# Patient Record
Sex: Female | Born: 1975 | Race: White | Hispanic: No | Marital: Married | Smoking: Never smoker
Health system: Southern US, Community
[De-identification: ages and names within clinical notes are randomized; demographics above are authoritative.]

## PROBLEM LIST (undated history)

## (undated) DIAGNOSIS — T8859XA Other complications of anesthesia, initial encounter: Secondary | ICD-10-CM

## (undated) DIAGNOSIS — T4145XA Adverse effect of unspecified anesthetic, initial encounter: Secondary | ICD-10-CM

## (undated) DIAGNOSIS — J302 Other seasonal allergic rhinitis: Secondary | ICD-10-CM

## (undated) HISTORY — PX: WISDOM TOOTH EXTRACTION: SHX21

## (undated) HISTORY — PX: TONSILLECTOMY: SUR1361

---

## 2013-11-26 NOTE — L&D Delivery Note (Signed)
Delivery Note At 11:05 AM a viable and healthy female was delivered via Vaginal, Spontaneous Delivery (Presentation: LOA ).  APGAR: 9, 9; weight  pending.   Placenta status: Intact, Spontaneous.  Cord: 3 vessels with the following complications: None.  Cord pH: na  Anesthesia: Epidural Local 1% xylocaine Episiotomy: None Lacerations: 2nd degree;Perineal Suture Repair: 2.0 3.0 vicryl rapide Est. Blood Loss (mL):  200  Mom to postpartum.  Baby to Couplet care / Skin to Skin.  Adaia Matthies J 11/17/2014, 11:29 AM

## 2014-05-12 LAB — OB RESULTS CONSOLE RPR: RPR: NONREACTIVE

## 2014-05-12 LAB — OB RESULTS CONSOLE HEPATITIS B SURFACE ANTIGEN: Hepatitis B Surface Ag: NEGATIVE

## 2014-05-12 LAB — OB RESULTS CONSOLE ANTIBODY SCREEN: ANTIBODY SCREEN: NEGATIVE

## 2014-05-12 LAB — OB RESULTS CONSOLE ABO/RH: RH Type: POSITIVE

## 2014-05-12 LAB — OB RESULTS CONSOLE HIV ANTIBODY (ROUTINE TESTING): HIV: NONREACTIVE

## 2014-05-12 LAB — OB RESULTS CONSOLE RUBELLA ANTIBODY, IGM: Rubella: IMMUNE

## 2014-05-19 LAB — OB RESULTS CONSOLE GC/CHLAMYDIA
Chlamydia: NEGATIVE
Gonorrhea: NEGATIVE

## 2014-08-10 ENCOUNTER — Inpatient Hospital Stay (HOSPITAL_COMMUNITY): Admission: AD | Admit: 2014-08-10 | Payer: Self-pay | Source: Ambulatory Visit | Admitting: Obstetrics and Gynecology

## 2014-11-17 ENCOUNTER — Inpatient Hospital Stay (HOSPITAL_COMMUNITY): Payer: 59 | Admitting: Anesthesiology

## 2014-11-17 ENCOUNTER — Inpatient Hospital Stay (HOSPITAL_COMMUNITY)
Admission: AD | Admit: 2014-11-17 | Discharge: 2014-11-19 | DRG: 775 | Disposition: A | Discharge status: Home / Self Care | Payer: 59 | Source: Ambulatory Visit | Attending: Obstetrics and Gynecology | Admitting: Obstetrics and Gynecology

## 2014-11-17 ENCOUNTER — Encounter (HOSPITAL_COMMUNITY): Payer: Self-pay | Admitting: *Deleted

## 2014-11-17 DIAGNOSIS — IMO0001 Reserved for inherently not codable concepts without codable children: Secondary | ICD-10-CM

## 2014-11-17 DIAGNOSIS — Z3403 Encounter for supervision of normal first pregnancy, third trimester: Secondary | ICD-10-CM | POA: Diagnosis present

## 2014-11-17 DIAGNOSIS — Z3A36 36 weeks gestation of pregnancy: Secondary | ICD-10-CM | POA: Diagnosis present

## 2014-11-17 HISTORY — DX: Other complications of anesthesia, initial encounter: T88.59XA

## 2014-11-17 HISTORY — DX: Adverse effect of unspecified anesthetic, initial encounter: T41.45XA

## 2014-11-17 LAB — CBC
HCT: 40.9 % (ref 36.0–46.0)
HEMOGLOBIN: 13.9 g/dL (ref 12.0–15.0)
MCH: 30.8 pg (ref 26.0–34.0)
MCHC: 34 g/dL (ref 30.0–36.0)
MCV: 90.7 fL (ref 78.0–100.0)
Platelets: 281 10*3/uL (ref 150–400)
RBC: 4.51 MIL/uL (ref 3.87–5.11)
RDW: 13.6 % (ref 11.5–15.5)
WBC: 10.2 10*3/uL (ref 4.0–10.5)

## 2014-11-17 LAB — OB RESULTS CONSOLE GBS: GBS: NEGATIVE

## 2014-11-17 LAB — RPR

## 2014-11-17 LAB — GROUP B STREP BY PCR: GROUP B STREP BY PCR: NEGATIVE

## 2014-11-17 MED ORDER — IBUPROFEN 600 MG PO TABS
600.0000 mg | ORAL_TABLET | Freq: Four times a day (QID) | ORAL | Status: DC
  Administered 2014-11-18 – 2014-11-19 (×5): 600 mg via ORAL
  Filled 2014-11-17 (×8): qty 1

## 2014-11-17 MED ORDER — PHENYLEPHRINE 40 MCG/ML (10ML) SYRINGE FOR IV PUSH (FOR BLOOD PRESSURE SUPPORT)
80.0000 ug | PREFILLED_SYRINGE | INTRAVENOUS | Status: DC | PRN
  Filled 2014-11-17: qty 2

## 2014-11-17 MED ORDER — ONDANSETRON HCL 4 MG/2ML IJ SOLN: 4.0000 mg | INTRAMUSCULAR | Status: DC | PRN

## 2014-11-17 MED ORDER — PHENYLEPHRINE 40 MCG/ML (10ML) SYRINGE FOR IV PUSH (FOR BLOOD PRESSURE SUPPORT)
PREFILLED_SYRINGE | INTRAVENOUS | Status: AC
  Filled 2014-11-17: qty 10

## 2014-11-17 MED ORDER — ZOLPIDEM TARTRATE 5 MG PO TABS: 5.0000 mg | ORAL_TABLET | Freq: Every evening | ORAL | Status: DC | PRN

## 2014-11-17 MED ORDER — DIPHENHYDRAMINE HCL 25 MG PO CAPS: 25.0000 mg | ORAL_CAPSULE | Freq: Four times a day (QID) | ORAL | Status: DC | PRN

## 2014-11-17 MED ORDER — SIMETHICONE 80 MG PO CHEW: 80.0000 mg | CHEWABLE_TABLET | ORAL | Status: DC | PRN

## 2014-11-17 MED ORDER — TERBUTALINE SULFATE 1 MG/ML IJ SOLN: 0.2500 mg | Freq: Once | INTRAMUSCULAR | Status: DC | PRN

## 2014-11-17 MED ORDER — ONDANSETRON HCL 4 MG/2ML IJ SOLN: 4.0000 mg | Freq: Four times a day (QID) | INTRAMUSCULAR | Status: DC | PRN

## 2014-11-17 MED ORDER — FLEET ENEMA 7-19 GM/118ML RE ENEM: 1.0000 | ENEMA | RECTAL | Status: DC | PRN

## 2014-11-17 MED ORDER — DIBUCAINE 1 % RE OINT
1.0000 "application " | TOPICAL_OINTMENT | RECTAL | Status: DC | PRN
  Administered 2014-11-19: 1 via RECTAL
  Filled 2014-11-17: qty 28

## 2014-11-17 MED ORDER — LIDOCAINE HCL (PF) 1 % IJ SOLN
30.0000 mL | INTRAMUSCULAR | Status: DC | PRN
  Administered 2014-11-17: 30 mL via SUBCUTANEOUS
  Filled 2014-11-17: qty 30

## 2014-11-17 MED ORDER — LACTATED RINGERS IV SOLN: 500.0000 mL | INTRAVENOUS | Status: DC | PRN

## 2014-11-17 MED ORDER — SENNOSIDES-DOCUSATE SODIUM 8.6-50 MG PO TABS
2.0000 | ORAL_TABLET | ORAL | Status: DC
  Administered 2014-11-18 – 2014-11-19 (×2): 2 via ORAL
  Filled 2014-11-17 (×2): qty 2

## 2014-11-17 MED ORDER — WITCH HAZEL-GLYCERIN EX PADS
1.0000 "application " | MEDICATED_PAD | CUTANEOUS | Status: DC | PRN
  Administered 2014-11-19: 1 via TOPICAL

## 2014-11-17 MED ORDER — LACTATED RINGERS IV SOLN
INTRAVENOUS | Status: DC
  Administered 2014-11-17 (×2): via INTRAVENOUS

## 2014-11-17 MED ORDER — EPHEDRINE 5 MG/ML INJ
10.0000 mg | INTRAVENOUS | Status: DC | PRN
  Filled 2014-11-17: qty 2

## 2014-11-17 MED ORDER — FENTANYL 2.5 MCG/ML BUPIVACAINE 1/10 % EPIDURAL INFUSION (WH - ANES)
14.0000 mL/h | INTRAMUSCULAR | Status: DC | PRN
  Administered 2014-11-17: 14 mL/h via EPIDURAL

## 2014-11-17 MED ORDER — METHYLERGONOVINE MALEATE 0.2 MG PO TABS: 0.2000 mg | ORAL_TABLET | ORAL | Status: DC | PRN

## 2014-11-17 MED ORDER — OXYTOCIN BOLUS FROM INFUSION: 500.0000 mL | INTRAVENOUS | Status: DC

## 2014-11-17 MED ORDER — LIDOCAINE HCL (PF) 1 % IJ SOLN
INTRAMUSCULAR | Status: DC | PRN
  Administered 2014-11-17: 4 mL
  Administered 2014-11-17: 6 mL

## 2014-11-17 MED ORDER — OXYCODONE-ACETAMINOPHEN 5-325 MG PO TABS: 1.0000 | ORAL_TABLET | ORAL | Status: DC | PRN

## 2014-11-17 MED ORDER — TETANUS-DIPHTH-ACELL PERTUSSIS 5-2.5-18.5 LF-MCG/0.5 IM SUSP: 0.5000 mL | Freq: Once | INTRAMUSCULAR | Status: DC

## 2014-11-17 MED ORDER — PRENATAL MULTIVITAMIN CH
1.0000 | ORAL_TABLET | Freq: Every day | ORAL | Status: DC
  Administered 2014-11-18: 1 via ORAL
  Filled 2014-11-17 (×2): qty 1

## 2014-11-17 MED ORDER — FENTANYL 2.5 MCG/ML BUPIVACAINE 1/10 % EPIDURAL INFUSION (WH - ANES)
INTRAMUSCULAR | Status: AC
  Filled 2014-11-17: qty 125

## 2014-11-17 MED ORDER — METHYLERGONOVINE MALEATE 0.2 MG/ML IJ SOLN: 0.2000 mg | INTRAMUSCULAR | Status: DC | PRN

## 2014-11-17 MED ORDER — FENTANYL 2.5 MCG/ML BUPIVACAINE 1/10 % EPIDURAL INFUSION (WH - ANES): 14.0000 mL/h | INTRAMUSCULAR | Status: DC | PRN

## 2014-11-17 MED ORDER — LACTATED RINGERS IV SOLN
500.0000 mL | Freq: Once | INTRAVENOUS | Status: AC
  Administered 2014-11-17: 500 mL via INTRAVENOUS

## 2014-11-17 MED ORDER — OXYCODONE-ACETAMINOPHEN 5-325 MG PO TABS: 2.0000 | ORAL_TABLET | ORAL | Status: DC | PRN

## 2014-11-17 MED ORDER — CITRIC ACID-SODIUM CITRATE 334-500 MG/5ML PO SOLN: 30.0000 mL | ORAL | Status: DC | PRN

## 2014-11-17 MED ORDER — TERBUTALINE SULFATE 1 MG/ML IJ SOLN
0.2500 mg | Freq: Once | INTRAMUSCULAR | Status: DC | PRN
Start: 2014-11-17 — End: 2014-11-17

## 2014-11-17 MED ORDER — OXYTOCIN 40 UNITS IN LACTATED RINGERS INFUSION - SIMPLE MED
62.5000 mL/h | INTRAVENOUS | Status: DC
  Administered 2014-11-17: 62.5 mL/h via INTRAVENOUS

## 2014-11-17 MED ORDER — BENZOCAINE-MENTHOL 20-0.5 % EX AERO
1.0000 "application " | INHALATION_SPRAY | CUTANEOUS | Status: DC | PRN
  Administered 2014-11-17: 1 via TOPICAL
  Filled 2014-11-17: qty 56

## 2014-11-17 MED ORDER — DIPHENHYDRAMINE HCL 50 MG/ML IJ SOLN: 12.5000 mg | INTRAMUSCULAR | Status: DC | PRN

## 2014-11-17 MED ORDER — OXYTOCIN 40 UNITS IN LACTATED RINGERS INFUSION - SIMPLE MED
1.0000 m[IU]/min | INTRAVENOUS | Status: DC
  Administered 2014-11-17: 2 m[IU]/min via INTRAVENOUS
  Filled 2014-11-17: qty 1000

## 2014-11-17 MED ORDER — ONDANSETRON HCL 4 MG PO TABS: 4.0000 mg | ORAL_TABLET | ORAL | Status: DC | PRN

## 2014-11-17 MED ORDER — LANOLIN HYDROUS EX OINT: TOPICAL_OINTMENT | CUTANEOUS | Status: DC | PRN

## 2014-11-17 MED ORDER — ACETAMINOPHEN 325 MG PO TABS: 650.0000 mg | ORAL_TABLET | ORAL | Status: DC | PRN

## 2014-11-17 NOTE — Anesthesia Procedure Notes (Signed)
Epidural Patient location during procedure: OB  Preanesthetic Checklist Completed: patient identified, site marked, surgical consent, pre-op evaluation, timeout performed, IV checked, risks and benefits discussed and monitors and equipment checked  Epidural Patient position: sitting Prep: site prepped and draped and DuraPrep Patient monitoring: continuous pulse ox and blood pressure Approach: midline Location: L3-L4 Injection technique: LOR air  Needle:  Needle type: Tuohy  Needle gauge: 17 G Needle length: 9 cm and 9 Needle insertion depth: 4 cm Catheter type: closed end flexible Catheter size: 19 Gauge Catheter at skin depth: 10 cm Test dose: negative  Assessment Events: blood not aspirated, injection not painful, no injection resistance, negative IV test and no paresthesia  Additional Notes Dosing of Epidural:  1st dose, through catheter .............................................  Xylocaine 40 mg  2nd dose, through catheter, after waiting 3 minutes.........Xylocaine 60 mg    ( 1% Xylo charted as a single dose in Epic Meds for ease of charting; actual dosing was fractionated as above, for saftey's sake)  As each dose occurred, patient was free of IV sx; and patient exhibited no evidence of SA injection.  Patient is more comfortable after epidural dosed. Please see RN's note for documentation of vital signs,and FHR which are stable.  Patient reminded not to try to ambulate with numb legs, and that an RN must be present when she attempts to get up.       

## 2014-11-17 NOTE — Anesthesia Preprocedure Evaluation (Signed)

## 2014-11-17 NOTE — MAU Note (Signed)
Rapid GBS collected by Katha Hammingenise Warren-Collison, RN

## 2014-11-17 NOTE — Anesthesia Postprocedure Evaluation (Signed)
  Anesthesia Post-op Note  Anesthesia Post Note  Patient: Ermalinda BarriosJulia Stefan  Procedure(s) Performed: * No procedures listed *  Anesthesia type: Epidural  Patient location: Mother/Baby  Post pain: Pain level controlled  Post assessment: Post-op Vital signs reviewed  Last Vitals:  Filed Vitals:   11/17/14 1450  BP: 127/71  Pulse: 100  Temp:   Resp: 18    Post vital signs: Reviewed  Level of consciousness:alert  Complications: No apparent anesthesia complications

## 2014-11-17 NOTE — Progress Notes (Signed)
Admission nutrition screen triggered for unintentional weight loss > 10 lbs within the last month. PNR indicates that there has been no weight loss. Patients chart reviewed and assessed  for nutritional risk. Patient is determined to be at low nutrition  risk.   Elisabeth CaraKatherine Glady Ouderkirk M.Odis LusterEd. R.D. LDN Neonatal Nutrition Support Specialist/RD III Pager [phone]

## 2014-11-17 NOTE — MAU Note (Signed)
PT  SAYS SHE WAS   SLEEPING  AND  SHE FELT A  GUSH-    CLEAR.    VE IN OFFICE -   LAST WEEK-  CLOSED -    TOLD  HEAD  DOWN.     DENIES HSV AND MRSA.   GBS-   NO  RESULTS.   NO UC.

## 2014-11-17 NOTE — H&P (Signed)
Destiny Stewart is a 38 y.o. female presenting for SROM at 1245.  Maternal Medical History:  Reason for admission: Rupture of membranes.   Contractions: Onset was less than 1 hour ago.   Perceived severity is mild.    Fetal activity: Perceived fetal activity is normal.   Last perceived fetal movement was within the past hour.    Prenatal complications: no prenatal complications Prenatal Complications - Diabetes: none.    OB History    Gravida Para Term Preterm AB TAB SAB Ectopic Multiple Living   1              Past Medical History  Diagnosis Date  . Complication of anesthesia    Past Surgical History  Procedure Laterality Date  . Tonsillectomy    . Wisdom tooth extraction     Family History: family history is not on file. Social History:  reports that she has never smoked. She does not have any smokeless tobacco history on file. She reports that she does not drink alcohol or use illicit drugs.   Prenatal Transfer Tool  Maternal Diabetes: No Genetic Screening: Normal Maternal Ultrasounds/Referrals: Normal Fetal Ultrasounds or other Referrals:  None Maternal Substance Abuse:  No Significant Maternal Medications:  None Significant Maternal Lab Results:  None Other Comments:  None  Review of Systems  All other systems reviewed and are negative.   Dilation: 1 Effacement (%): 70 Station: -3 Exam by:: Destiny Stewart Blood pressure 122/91, pulse 89, temperature 98.7 F (37.1 C), temperature source Oral, resp. rate 18, height 5\' 7"  (1.702 m), weight 81.647 kg (180 lb). Maternal Exam:  Uterine Assessment: Contraction strength is mild.  Contraction frequency is irregular.   Abdomen: Patient reports no abdominal tenderness. Fetal presentation: vertex  Introitus: Normal vulva. Normal vagina.  Ferning test: positive.  Nitrazine test: positive. Amniotic fluid character: clear.  Pelvis: adequate for delivery.   Cervix: Cervix evaluated by digital exam.     Physical  Exam  Nursing note and vitals reviewed. Constitutional: She is oriented to person, place, and time. She appears well-developed and well-nourished.  HENT:  Head: Normocephalic and atraumatic.  Neck: Normal range of motion. Neck supple.  Cardiovascular: Normal rate and regular rhythm.   Respiratory: Effort normal and breath sounds normal.  GI: Soft. Bowel sounds are normal.  Genitourinary: Vagina normal and uterus normal.  Musculoskeletal: Normal range of motion.  Neurological: She is alert and oriented to person, place, and time. She has normal reflexes.  Skin: Skin is warm and dry.  Psychiatric: She has a normal mood and affect.    Prenatal labs: ABO, Rh: A/Positive/-- (06/17 0000) Antibody: Negative (06/17 0000) Rubella: Immune (06/17 0000) RPR: Nonreactive (06/17 0000)  HBsAg: Negative (06/17 0000)  HIV: Non-reactive (06/17 0000)  GBS: Negative (12/23 0215)   Assessment/Plan: Preterm SROM Admit Rapid GBS Augment prn   Destiny Stewart 11/17/2014, 7:06 AM

## 2014-11-17 NOTE — Lactation Note (Signed)
This note was copied from the chart of Destiny Ermalinda BarriosJulia Denio. Lactation Consultation Note Initial visit at 12 hours of age.  Mom reports baby has latched a few times and begins to get sleepy quickly.  Discussed late preterm baby behaviors with information sheet shared.  Set up DEBP with instructions on use, cleaning and milk storage.  Mom plans to post pump every 3 hours/ after feedings for 15 minutes on preemie setting then hand express and spoon feed any collected colostrum. Mom has a inverted nipple on right breast with yellow colostrum visible.  Hand pump pulls nipple out briefly and then flattens with compression.  Encouraged mom to attempt with hand pump and baby latching before attempting with nipple shield.  Tiny abrasion noted on side of nipple with drop of blood.  Encouraged mom to rub into nipples EBE for comfort.  Pumping plan discussed with MBU RN.  [facility] resources given and discussed.  Encouraged to feed with early cues on demand.  Early newborn behavior discussed.  Hand expression demonstrated with colostrum visible.  Mom to call for assist as needed.     Patient Name: Destiny Ermalinda BarriosJulia Cerny Today's Date: 11/17/2014 Reason for consult: Initial assessment   Maternal Data Has patient been taught Hand Expression?: Yes Does the patient have breastfeeding experience prior to this delivery?: No  Feeding    LATCH Score/Interventions                      Lactation Tools Discussed/Used Pump Review: Setup, frequency, and cleaning Initiated by:: JS Date initiated:: 11/17/14   Consult Status Consult Status: Follow-up Date: 11/18/14 Follow-up type: In-patient    Jannifer RodneyShoptaw, Suda Forbess Lynn 11/17/2014, 11:09 PM

## 2014-11-18 LAB — CBC
HEMATOCRIT: 29.8 % — AB (ref 36.0–46.0)
HEMOGLOBIN: 9.9 g/dL — AB (ref 12.0–15.0)
MCH: 30.6 pg (ref 26.0–34.0)
MCHC: 33.2 g/dL (ref 30.0–36.0)
MCV: 92 fL (ref 78.0–100.0)
Platelets: 226 10*3/uL (ref 150–400)
RBC: 3.24 MIL/uL — AB (ref 3.87–5.11)
RDW: 13.8 % (ref 11.5–15.5)
WBC: 13.9 10*3/uL — AB (ref 4.0–10.5)

## 2014-11-18 NOTE — Progress Notes (Signed)
PPD 1 SVD  S:  Reports feeling well             Tolerating po/ No nausea or vomiting             Bleeding is light             Pain controlled with motrin but feels like she does not even need that             Up ad lib / ambulatory / voiding QS  Newborn breast feeding with poor feeding - lactation assisting / supplementing with formula per Peds             Circumcision planned tomorrow AM - baby not feeding well enough for DC today  O:               VS: BP 122/70 mmHg  Pulse 75  Temp(Src) 98.4 F (36.9 C) (Oral)  Resp 18  Ht 5\' 7"  (1.702 m)  Wt 81.647 kg (180 lb)  BMI 28.19 kg/m2  SpO2 99%  Breastfeeding? Unknown   LABS:              Recent Labs  11/17/14 0200 11/18/14 0625  WBC 10.2 13.9*  HGB 13.9 9.9*  PLT 281 226               Blood type: A/Positive/-- (06/17 0000)  Rubella: Immune (06/17 0000)                     I&O: Intake/Output      12/23 0701 - 12/24 0700 12/24 0701 - 12/25 0700   Blood 200    Total Output 200     Net -200                        Physical Exam:             Alert and oriented X3  Lungs: Clear and unlabored  Heart: regular rate and rhythm / no mumurs  Abdomen: soft, non-tender, non-distended              Fundus: firm, non-tender, U-1  Perineum: mild edema  Lochia: light  Extremities: no edema, no calf pain or tenderness    A: PPD # 1   Doing well - stable status  P: Routine post partum orders  Anticipate DC tomorrow             Lactation assist today  [name], Christel Bai CNM, MSN, [facility] 11/18/2014, 10:07 AM

## 2014-11-18 NOTE — Lactation Note (Signed)
This note was copied from the chart of Destiny Stewart. Lactation Consultation Note  Patient Name: Destiny Stewart ZOXWR'UToday's Date: 11/18/2014 Reason for consult: Follow-up assessment;Late preterm infant LC observed baby latched to (R) with NS and mom says she needed several attempts to get him to latch and he has been sucking intermittently but she is not sure if baby is "getting anything" yet.  Baby is STS and positioned in cradle hold so LC suggests trying cross-cradle and baby slips off but re-latches easily.  There is visible colostrum in NS.  LC reviewed LPI supplement recommendations and encouraged mom to now give 10-20 ml's of ebm or formula after breastfeeding for 20-30 minutes.  Alternate breast compression while baby is feeding and ensuring deep latch and areolar grasp throughout feeding recommended.  LC discussed how colostrum flow with DEBP is difficult and encouraged her to also combine some breast massage and hand expression with pumping, pump q3h for 15 minutes.    Maternal Data    Feeding Feeding Type: Breast Fed Length of feed: 15 min  LATCH Score/Interventions Latch: Repeated attempts needed to sustain latch, nipple held in mouth throughout feeding, stimulation needed to elicit sucking reflex. (per mom's report) Intervention(s): Skin to skin;Waking techniques (breast compression and stimulation to encourage sucking) Intervention(s): Adjust position;Breast compression (switched to cross-cradle with LC after 15 minutes)  Audible Swallowing: A few with stimulation Intervention(s): Skin to skin;Hand expression Intervention(s): Skin to skin;Hand expression;Alternate breast massage  Type of Nipple: Everted at rest and after stimulation  Comfort (Breast/Nipple): Soft / non-tender     Hold (Positioning): No assistance needed to correctly position infant at breast. Intervention(s): Breastfeeding basics reviewed;Support Pillows;Position options;Skin to skin (encouraged  cross-cradle for more control of breast and baby throughout feeding)  LATCH Score: 8 (LC observed abd then assisted with re-latching and use of cross-cradle position)  Lactation Tools Discussed/Used Tools: Nipple Shields;Pump;Other (comment) (curved-tip syringe for finger-feeding) LPI plan: limit breastfeeding to 20-30 minutes and stimulate baby as needed, feed q3h and then DEBP for 15 minutes, feed ebm and/or formula (10-20 ml's based on day of life)  Consult Status Consult Status: Follow-up Date: 11/19/14 Follow-up type: In-patient    Warrick ParisianBryant, [facility] 11/18/2014, 4:37 PM

## 2014-11-18 NOTE — Lactation Note (Signed)
This note was copied from the chart of Destiny Ermalinda BarriosJulia Gum. Lactation Consultation Note  Patient Name: Destiny Ermalinda BarriosJulia Stewart ZOXWR'UToday's Date: 11/18/2014 Reason for consult: Follow-up assessment;Late preterm infant.  RN, Gaylyn RongKris asked LC to provide comfort gelpads   Maternal Data    Feeding Feeding Type: Breast Fed Length of feed: 25 min  LATCH Score/Interventions Latch: Repeated attempts needed to sustain latch, nipple held in mouth throughout feeding, stimulation needed to elicit sucking reflex. (per mom's report) Intervention(s): Skin to skin;Waking techniques (breast compression and stimulation to encourage sucking) Intervention(s): Adjust position;Breast compression (switched to cross-cradle with LC after 15 minutes)  Audible Swallowing: A few with stimulation Intervention(s): Skin to skin;Hand expression Intervention(s): Skin to skin;Hand expression;Alternate breast massage  Type of Nipple: Everted at rest and after stimulation  Comfort (Breast/Nipple): Soft / non-tender (RN, Gaylyn RongKris asked LC to provide comfort gelpads)     Hold (Positioning): No assistance needed to correctly position infant at breast. Intervention(s): Breastfeeding basics reviewed;Support Pillows;Position options;Skin to skin (encouraged cross-cradle for more control of breast and baby throughout feeding)  LATCH Score: 8  Lactation Tools Discussed/Used Tools: Nipple Shields;Pump;Other (comment);Comfort gels (curved-tip syringe for finger-feeding) Nipple shield size: 24 Comfort gelpads given with instructions for use after ebm on nipples  Consult Status Consult Status: Follow-up Date: 11/19/14 Follow-up type: In-patient    Warrick ParisianBryant, Jens Siems [facility] 11/18/2014, 5:07 PM

## 2014-11-19 MED ORDER — OXYCODONE-ACETAMINOPHEN 5-325 MG PO TABS: 1.0000 | ORAL_TABLET | ORAL | Status: DC | PRN

## 2014-11-19 MED ORDER — IBUPROFEN 600 MG PO TABS: 600.0000 mg | ORAL_TABLET | Freq: Four times a day (QID) | ORAL | Status: DC

## 2014-11-19 NOTE — Discharge Summary (Signed)
Obstetric Discharge Summary  Reason for Admission: onset of labor Prenatal Procedures: none Intrapartum Procedures: spontaneous vaginal delivery Postpartum Procedures: none Complications-Operative and Postpartum: 2nd degree perineal laceration HEMOGLOBIN  Date Value Ref Range Status  11/18/2014 9.9* 12.0 - 15.0 g/dL Final    Comment:    DELTA CHECK NOTED REPEATED TO VERIFY    HCT  Date Value Ref Range Status  11/18/2014 29.8* 36.0 - 46.0 % Final    Physical Exam:  General: alert, cooperative and no distress Lochia: appropriate Uterine Fundus: firm Incision: healing well DVT Evaluation: No evidence of DVT seen on physical exam.  Discharge Diagnoses: Term Pregnancy-delivered  Discharge Information: Date: 11/19/2014 Activity: pelvic rest Diet: routine Medications: PNV, Ibuprofen and Percocet Condition: stable Instructions: refer to practice specific booklet Discharge to: home Follow-up Information    Follow up with Destiny AdenAAVON,[name], MD. Schedule an appointment as soon as possible for a visit in 6 weeks.   Specialty:  Obstetrics and Gynecology   Contact information:   [address] [place] [address] [phone]       Newborn Data: Live born female  Birth Weight: 6 lb 14.1 oz (3120 g) APGAR: 9, 9  Home with mother.  Destiny Stewart 11/19/2014, 12:27 PM

## 2014-11-19 NOTE — Progress Notes (Signed)
PPD 2 SVD  S:  Reports feeling well - ready to go home             Tolerating po/ No nausea or vomiting             Bleeding is light             Pain controlled with motrin and percocet             Up ad lib / ambulatory / voiding QS  Newborn breast feeding  / Circumcision planned today O:               VS: BP 117/53 mmHg  Pulse 69  Temp(Src) 98 F (36.7 C) (Oral)  Resp 20  Ht 5\' 7"  (1.702 m)  Wt 81.647 kg (180 lb)  BMI 28.19 kg/m2  SpO2 100%  Breastfeeding? Unknown   LABS:              Recent Labs  11/17/14 0200 11/18/14 0625  WBC 10.2 13.9*  HGB 13.9 9.9*  PLT 281 226               Blood type: A/Positive/-- (06/17 0000)  Rubella: Immune (06/17 0000)                      Physical Exam:             Alert and oriented X3  Abdomen: soft, non-tender, non-distended              Fundus: firm, non-tender, U-1  Perineum: mild edema  Lochia: light  Extremities: no edema, no calf pain or tenderness  A: PPD # 2   Doing well - stable status  P: Routine post partum orders  DC home  [name], Gretna Bergin CNM, MSN, [facility] 11/19/2014, 11:39 AM

## 2014-11-19 NOTE — Lactation Note (Signed)
This note was copied from the chart of Destiny Ermalinda BarriosJulia Stewart. Lactation Consultation Note  Patient Name: Destiny Ermalinda BarriosJulia Stewart ZOXWR'UToday's Date: 11/19/2014 Reason for consult: Follow-up assessment;Late preterm infant Baby was finishing his feeding when I arrived. Mom using #24 nipple shield to latch baby, scant colostrum in the nipple shield when Mom removed from breast. Mom has pumped few times and reports receiving approx 2 ml with last pumping. She is supplementing with Alimentum 20 cal.  Baby going for circumcision. LC reviewed LPT behaviors with Mom, she has handout as well. Mom reports Peds advised her to reduce supplements to 5-10 ml with feedings since baby has been going to the breast frequently and output is adequate per hours of age. LC encouraged Mom to BF with feeding ques, but at least every 3 hours. Advised to BF for 15-20 minutes both breasts when possible. Did discuss limiting time at breast to conserve calorie usage. Advised Mom to post pump after feedings to encourage milk production, prevent engorgement and protect milk supply. Advised to continue to supplement baby till next weight check to minimize weight loss and so baby will have energy to BF. OP f/u with lactation scheduled for Wednesday, 11/24/14 at 1:00pm. Handout given to review nipple shield use. Engorgement care reviewed if needed. Advised of support group. Encouraged Mom to call if baby BF again before d/c today.   Maternal Data    Feeding Feeding Type: Breast Fed Length of feed: 30 min  LATCH Score/Interventions Latch: Grasps breast easily, tongue down, lips flanged, rhythmical sucking. Intervention(s): Skin to skin;Waking techniques Intervention(s): Adjust position;Assist with latch;Breast compression  Audible Swallowing: Spontaneous and intermittent Intervention(s): Skin to skin  Type of Nipple: Everted at rest and after stimulation  Comfort (Breast/Nipple): Filling, red/small blisters or bruises, mild/mod  discomfort  Problem noted: Mild/Moderate discomfort Interventions (Mild/moderate discomfort): Comfort gels  Hold (Positioning): Assistance needed to correctly position infant at breast and maintain latch. Intervention(s): Breastfeeding basics reviewed;Support Pillows  LATCH Score: 8  Lactation Tools Discussed/Used Tools: Shells;Nipple Shields;Pump;Comfort gels Nipple shield size: 24 Shell Type: Inverted Breast pump type: Double-Electric Breast Pump   Consult Status Consult Status: Complete Date: 11/19/14 Follow-up type: In-patient    [name], [name] 11/19/2014, 2:17 PM

## 2014-11-24 ENCOUNTER — Ambulatory Visit (HOSPITAL_COMMUNITY)
Admit: 2014-11-24 | Discharge: 2014-11-24 | Disposition: A | Discharge status: Home / Self Care | Payer: 59 | Attending: Obstetrics and Gynecology | Admitting: Obstetrics and Gynecology

## 2014-11-24 NOTE — Lactation Note (Signed)
Lactation Consult  Mother's reason for visit: per mom "feel like I'm making progress but feeling sore  Visit Type:  Feeding assessment  Appointment Notes: Nipple shield , late preterm infant  Consult:  Initial Lactation Consultant:  Kathrin GreathouseIorio, Dorris Vangorder Ann  ________________________________________________________________________  Baby's Name: Destiny Stewart Date of Birth: 11/17/2014 Pediatrician: Dr. Eartha Stewart  Gender: female Gestational Age: 3040w3d (At Birth) Birth Weight: 6 lb 14.1 oz (3120 g) Weight at Discharge: Weight: 6 lb 6.3 oz (2900 g)[facility]   [facility] 1105 11/17/14 2334 11/18/14 2355  Weight: 6 lb 14.1 oz (3120 g) 6 lb 10.9 oz (3030 g) 6 lb 6.3 oz (2900 g)   [facility]:6-3 oz 12/28  Location:Pediatrician's office Weight today: 6-4.2 oz 2840 g      Admission Information      ________________________________________________________________________  Mother's Name: Destiny Stewart Type of delivery:  Vaginal Delivery  Breastfeeding Experience:  Per mom  "feel like I'm making progress, but feeling sore "  Maternal Medical Conditions:  No risk  Maternal Medications:  PNV,  Motrin   ________________________________________________________________________  Breastfeeding History (Post Discharge)  Frequency of breastfeeding:  8-12 times  Duration of feeding:  15-30 mins per side   Pumping : Medela - every 3 hours or after feedings - 25 -30 ml   Supplementing: occasionally with a bottle of EBM or formula   Infant Intake and Output Assessment  Voids: 6-8  in 24 hrs.  Color:  Clear yellow Stools:  1-3  in 24 hrs.  Color:  Brown  ________________________________________________________________________  Maternal Breast Assessment  Breast:  Full Nipple:  Erect Pain level:  0- 2  Pain interventions:  Expressed breast  milk  _______________________________________________________________________ Feeding Assessment/Evaluation  Initial feeding assessment:  Infant's oral assessment:  Variance - short labial frenulum and short anterior frenulum   Positioning:  Cross cradle Left breast  LATCH documentation:  Latch:  2 = Grasps breast easily, tongue down, lips flanged, rhythmical sucking.  Audible swallowing:  2 = Spontaneous and intermittent  Type of nipple:  2 = Everted at rest and after stimulation  Comfort (Breast/Nipple):  1 = Filling, red/small blisters or bruises, mild/mod discomfort  Hold (Positioning):  1 = Assistance needed to correctly position infant at breast and maintain latch  LATCH score: 8   Attached assessment:  Shallow  Lips flanged:  No.  Lips untucked:  Yes.    Suck assessment:  Nutritive and Nonnutritive  Tools:  None  Instructed on use and cleaning of tool:  Yes.    Pre-feed weight:  2820 g , 6-4.2 oz  Post-feed weight:  2848 g , 6-4.5 oz  Amount transferred:  8 ml  Amount supplemented:  None   Additional Feeding Assessment -   Infant's oral assessment:  Variance- see above note   Positioning:  Cross cradle Left breast  LATCH documentation:  Latch:  2 = Grasps breast easily, tongue down, lips flanged, rhythmical sucking.  Audible swallowing:  1 = A few with stimulation  Type of nipple:  2 = Everted at rest and after stimulation  Comfort (Breast/Nipple):  1 = Filling, red/small blisters or bruises, mild/mod discomfort  Hold (Positioning):  1 = Assistance needed to correctly position infant at breast and maintain latch  LATCH score:  7   Attached assessment:  Shallow  Lips flanged:  No.  Lips untucked:  Yes.    Suck assessment:  Nutritive and Nonnutritive  Tools:  Nipple shield  24 mm Instructed on use and cleaning of tool:  Yes.    Pre-feed weight:  2848 g , 6-4.5 oz  Post-feed weight:  2846 g , 6-4.4 oz  Amount transferred:  2 ml  Amount supplemented:  30   ml of EBM form home    Total amount pumped post feed: mom did not pump   Total amount transferred:  8  ml Total supplement given:  30  Ml Total volume for this feeding: 38 ml   Lactation Impression;  LC feels milk supply is low at this consult , possible moms isn't letting down .  See LC plan   Lactation Plan of care - Comfort gels for sore nipples , increased flanges to #27 if needed due to soreness Breast feeding goal - working on latch , and pumping , ( increasing milk supply )  And for baby to receive adequate calories to grow  Feedings every 2 1/2 - 3 hours , and with feeding cues, feeding with a nipple shield ( due to high palate and frenulum challenge )  Option #1 - feed at the breast 15 20 mins , both breast when [name] is in a active mode , may need to top off with supplement  Option #2 - Feed at the breast one side and supplement , and post pump for 10 -15 mins  Watch Noah for being non - nutritive  Option #3 - [name] receives a bottle ( medium based nipple ) 45 - 60 ml and mom pumps both breast for 15 20 mins   LC feels the frenulum challenge needs to be assessed  Due to baby's low weight gain , and moms sore nipples that are slow to recover .   Lactation Plan of  Care :

## 2014-12-02 ENCOUNTER — Ambulatory Visit (HOSPITAL_COMMUNITY)
Admission: RE | Admit: 2014-12-02 | Discharge: 2014-12-02 | Disposition: A | Discharge status: Home / Self Care | Payer: 59 | Source: Ambulatory Visit | Attending: Obstetrics and Gynecology | Admitting: Obstetrics and Gynecology

## 2014-12-02 NOTE — Lactation Note (Signed)
Lactation Consult  Mother's reason for visit:  F/U visit from 11/24/14 - Mom reports she has stopped BF due to nipple pain as of Saturday 11/27/14. She has been pump and bottle feeding since that time.  Visit Type:  Outpatient - Painful latch, feeding assessment Appointment Notes: Destiny Stewart is now 122 weeks old. Mom is having recurring difficulty with latch and trauma to right nipple. She would like help with latch and Mom is concerned about milk supply.   Consult:  Follow-Up Lactation Consultant:  Destiny Stewart, Destiny Stewart  ________________________________________________________________________   Destiny FloresBaby's Name: Destiny Stewart Date of Birth: 11/17/2014 Pediatrician: Destiny Stewart Gender: female Gestational Age: 4584w3d (At Birth) Birth Weight: 6 lb 14.1 oz (3120 g) Weight at Discharge: Weight: 6 lb 6.3 oz (2900 g)Date [facility]: [facility]   [facility] 1105 11/17/14 2334 11/18/14 2355  Weight: 6 lb 14.1 oz (3120 g) 6 lb 10.9 oz (3030 g) 6 lb 6.3 oz (2900 g)   [facility]: 7 lb. 3. 0 oz 12/01/14 Location:Pediatrician's office Weight today: 7 lb. 5.5 oz/3330 gm     ________________________________________________________________________  Mother's Name: Destiny Stewart Type of delivery:  SVB Breastfeeding Experience:  None P1 Maternal Medical Conditions:  Infertility Maternal Medications:  Motrin prn, stool softner, PNV, Mother's Milk Tea, Lactation cookies  ________________________________________________________________________  Breastfeeding History (Post Discharge)  Frequency of breastfeeding:  Stopped BF as of 11/27/14 due to nipple pain Duration of feeding:  N/A  Supplementation - Mom reports she gives EBM 3 oz each feeding and if baby still hungry will give an additional 1 oz of formula.   Formula:  Volume 30 ml Frequency:  varies Total volume per day:  Varies ml       Brand: Similac Alimentum     Breastmilk:  Volume 90 ml Frequency:  Q 3 hours Total volume per day:  720 ml  Method:  Bottle  Infant Intake and Output Assessment  Voids:  6+ in 24 hrs.  Color:  Clear yellow Stools:  3+ in 24 hrs.  Color:  Brown and Yellow  ________________________________________________________________________  Maternal Breast Assessment  Breast:  Soft Nipple:  Erect. Left nipple is red, dry. Right nipple has deep cracking, scabbed with small blister.  Pain level:  4 Pain interventions:  Expressed breast milk  _______________________________________________________________________ Feeding Assessment/Evaluation  Initial feeding assessment:  Infant's oral assessment:  Variance. Baby is noted to have short midline frenulum. Sucking blisters along lower lip and sucking blister on upper lip. Chewing/biting noted with suck exam.   Positioning:  Cross cradle Left breast  LATCH documentation:  Latch:  1 = Repeated attempts needed to sustain latch, nipple held in mouth throughout feeding, stimulation needed to elicit sucking reflex.  Audible swallowing:  1 = A few with stimulation  Type of nipple:  2 = Everted at rest and after stimulation  Comfort (Breast/Nipple):  2 = Soft / non-tender. Nipple/aerola red, swollen. Mom PS=0 per her report  Hold (Positioning):  1 = Assistance needed to correctly position infant at breast and maintain latch  LATCH score:  7  Attached assessment:  Shallow. Assisted Mom to sustain more depth with latch by helping with positioning and supporting breast with baby nursing.   Lips flanged:  No.  Assisted with bringing bottom lip down.   Lips untucked:  No. Intermittently  Suck assessment:  Displays both  Tools:  N/A Instructed on use and cleaning of tool:  N/A  Pre-feed weight:  3330  g  (7 lb. 5.5 oz.) Post-feed weight:  3340 g (7 lb. 5.8 oz.) Amount transferred:  10 ml with nursing for 15 minutes.  Amount supplemented:  0 ml  Additional Feeding Assessment  -   Infant's oral assessment:  Variance  Positioning:  Cross cradle Right breast  LATCH documentation:  Latch:  2 = Grasps breast easily, tongue down, lips flanged, rhythmical sucking. Used nipple shield to help with latch and so Mom could tolerate baby at breast due to trauma.  Audible swallowing:  1 = A few with stimulation  Type of nipple:  2 = Everted at rest and after stimulation  Comfort (Breast/Nipple):  0 = Engorged, cracked, bleeding, large blisters, severe discomfort  Hold (Positioning):  1 = Assistance needed to correctly position infant at breast and maintain latch  LATCH score:  6  Attached assessment:  Deep with using nipple shield and flanging lips baby was able to obtain more depth with latch.   Lips flanged:  Yes.  with assist  Lips untucked:  Yes.    Suck assessment:  Displays both  Tools:  Nipple shield 24 mm Instructed on use and cleaning of tool:  Yes.    Pre-feed weight:  3340 g  (7lb. 5.8 oz.) Post-feed weight:  3348 g (7 lb. 6.1 oz.) Amount transferred:  8 ml Amount supplemented:  49  Ml of EBM   Total amount pumped post feed:  R 12 ml    L 20 ml  Total amount transferred:  18 ml Total supplement given:  49 ml of EBM. 30 ml of EBM Mom brought with her to the appointment and an additional 19 ml of the breast milk she pumped at this visit.   With observing this feeding, baby is not transferring milk well from Mom's breast and has caused significant trauma to Mom's right nipple.  On suck exam, some restriction of tongue mobility is noted. Baby is observed to be chewing at the breast. Nutritive and Non nutritive suckling observed. Mom's milk supply is low. This may be due to baby's insufficiency at the breast and Mom's history of infertility could contribute to this as well. Plan discussed with Mom: Call OB for All Purpose Nipple Cream for nipple trauma. Encouraged supplement to help increase milk supply - Lactation Support Since baby not transferring milk  well at the breast - stressed importance of pumping every 3 hours for 15-20 minutes to increase/protect milk supply. Flanges changed to size 30 for comfort and better fit. Breastfeed as often as able - 8-12 times in 24 hours if able to tolerate baby at the breast. Use #24 nipple shield to latch on right breast. Use on left breast if needed.  Limit time at breast to 15 minutes each breast till nipples heal. Supplement with each feeding 45-60 ml if breastfeeding, If pump/bottle feeding increase to 60-90 ml each feeding. Also discussed pump/bottle feeding if breastfeeding continues to be painful and baby not transferring milk.  Consider consult with Stewart if interested in having frenulum re-evaluated. OP lactation f/u for Thursday, 12/09/14 at 4:00pm.

## 2014-12-09 ENCOUNTER — Ambulatory Visit (HOSPITAL_COMMUNITY): Payer: 59

## 2014-12-19 ENCOUNTER — Other Ambulatory Visit: Payer: Self-pay | Admitting: Certified Nurse Midwife

## 2014-12-19 ENCOUNTER — Inpatient Hospital Stay (HOSPITAL_COMMUNITY): Payer: 59

## 2014-12-19 ENCOUNTER — Encounter (HOSPITAL_COMMUNITY): Payer: Self-pay

## 2014-12-19 ENCOUNTER — Inpatient Hospital Stay (HOSPITAL_COMMUNITY)
Admission: AD | Admit: 2014-12-19 | Discharge: 2014-12-19 | Disposition: A | Discharge status: Home / Self Care | Payer: 59 | Source: Ambulatory Visit | Attending: Obstetrics and Gynecology | Admitting: Obstetrics and Gynecology

## 2014-12-19 DIAGNOSIS — O9089 Other complications of the puerperium, not elsewhere classified: Secondary | ICD-10-CM | POA: Insufficient documentation

## 2014-12-19 DIAGNOSIS — K5900 Constipation, unspecified: Secondary | ICD-10-CM | POA: Insufficient documentation

## 2014-12-19 DIAGNOSIS — B372 Candidiasis of skin and nail: Secondary | ICD-10-CM | POA: Diagnosis not present

## 2014-12-19 DIAGNOSIS — N644 Mastodynia: Secondary | ICD-10-CM | POA: Diagnosis not present

## 2014-12-19 LAB — CBC
HCT: 37.4 % (ref 36.0–46.0)
Hemoglobin: 12.3 g/dL (ref 12.0–15.0)
MCH: 29.9 pg (ref 26.0–34.0)
MCHC: 32.9 g/dL (ref 30.0–36.0)
MCV: 90.8 fL (ref 78.0–100.0)
Platelets: 272 10*3/uL (ref 150–400)
RBC: 4.12 MIL/uL (ref 3.87–5.11)
RDW: 13.1 % (ref 11.5–15.5)
WBC: 7.1 10*3/uL (ref 4.0–10.5)

## 2014-12-19 MED ORDER — MAGNESIUM 250 MG PO TABS: 1.0000 | ORAL_TABLET | Freq: Every day | ORAL | Status: DC

## 2014-12-19 MED ORDER — IBUPROFEN 800 MG PO TABS: 800.0000 mg | ORAL_TABLET | Freq: Three times a day (TID) | ORAL | Status: DC

## 2014-12-19 MED ORDER — METHYLERGONOVINE MALEATE 0.2 MG PO TABS: 0.2000 mg | ORAL_TABLET | Freq: Three times a day (TID) | ORAL | Status: DC

## 2014-12-19 MED ORDER — DOCUSATE SODIUM 100 MG PO CAPS: 100.0000 mg | ORAL_CAPSULE | Freq: Every day | ORAL | Status: DC | PRN

## 2014-12-19 MED ORDER — FLUCONAZOLE 150 MG PO TABS: 150.0000 mg | ORAL_TABLET | Freq: Every day | ORAL | Status: DC

## 2014-12-19 MED ORDER — CEPHALEXIN 500 MG PO CAPS: 500.0000 mg | ORAL_CAPSULE | Freq: Two times a day (BID) | ORAL | Status: AC

## 2014-12-19 NOTE — MAU Note (Signed)
Pt states bleeding for past two days. PP bleeding had tapered off. This am was in kitchen when started bleeding heavily. Changed pad x3 this am. One was completely soaked. EMS witnessed large clot in toilet. Denies pain.

## 2014-12-19 NOTE — Consult Note (Signed)
I was asked to see this mom of a 291 month old baby, due to extremely sore nipples. She has very excoriated, red, painful nipples, with yellow.tan exudate - this may be scabs that are moistened from pumping and EBM. Mom is already applying all purpose nipple cream. Anti-fungal medication was ordered for mam to take by nurse midwife. I gave mom sore nipple shells to wear, for comfort and to let air heal her nipples. Mom re[ports this feeling much better. I spoke to the parents about the fact that their baby may have a resrticted tongue from a tight frenulum. Mom is reluctant to have anything done to revise this. I advised her to not put the baby to breast until her nipples are healed in that case, since more damage will probably be done at this point. Mom was concerned about her milk supply, but was able to pump at east 9 ounces while in MAU. I told her she is doing a great job protecting her milk supply, and feeding her  baby, who is growing well, as per mom. Mom was also setting her suction too high, which was putting a lot of painful pressure on her already sore nipples. I lowered her suction, explained to her how to make a hands free bra, so she could massage while pumping, and protect her nipples.Mom is using 30 flanges, which feel better to her.  Mom knows to call for questions/ concerns.

## 2014-12-19 NOTE — MAU Provider Note (Signed)
History     CSN: [id number]  Arrival date and time: 12/19/14 0843 by EMS Call from nurse to provider @ [phone] - orders given Provider here to evaluate patient @ 1015    Chief Complaint  Patient presents with  . Postpartum Complications   HPI  Onset red bleeding Friday intermittently & spoke to CNM                                                                       (instructed to rest and hydrate - call if heavy bleeding) Spotting red again Saturday evening & spoke to CNM                                                                       (intructed Ibuprofen course x 48 hr every 6 hours) Again this AM just spotting then passed "grapefruit" sized clot followed by moderate bleeding with BRP  No active bleeding since arrival to hospital Scared by bleeding - worried about "hemorrhaging" and  "blood count" Constipation - uses colace Slept thru night - pumped x 1 does not remember if voided during night at all Motrin dose ( ) last PM none today  Past Medical History  Diagnosis Date  . Complication of anesthesia   . Postpartum care following vaginal delivery (12/23) 11/17/2014    Past Surgical History  Procedure Laterality Date  . Tonsillectomy    . Wisdom tooth extraction      History reviewed. No pertinent family history.  History  Substance Use Topics  . Smoking status: Never Smoker   . Smokeless tobacco: Never Used  . Alcohol Use: No    Allergies:  Allergies  Allergen Reactions  . Penicillins Rash    Prescriptions prior to admission  Medication Sig Dispense Refill Last Dose  . docusate sodium (COLACE) 100 MG capsule Take 100 mg by mouth daily as needed for mild constipation.   12/19/2014 at 0800  . ibuprofen (ADVIL,MOTRIN) 200 MG tablet Take 200-600 mg by mouth every 6 (six) hours as needed.   12/19/2014 at Unknown time  . OVER THE COUNTER MEDICATION Apply 1 application topically daily as needed (Nipple cream used as needed for inflammation.).   12/18/2014 at  Unknown time  . Prenatal Vit-Fe Fumarate-FA (PRENATAL MULTIVITAMIN) TABS tablet Take 1 tablet by mouth daily at 12 noon.    Past Week at Unknown time  . calcium carbonate (TUMS - DOSED IN MG ELEMENTAL CALCIUM) 500 MG chewable tablet Chew 1 tablet by mouth daily.   Not Taking at Unknown time  . ibuprofen (ADVIL,MOTRIN) 600 MG tablet Take 1 tablet (600 mg total) by mouth every 6 (six) hours. (Patient not taking: Reported on 12/19/2014) 30 tablet 0 Not Taking at Unknown time  . oxyCODONE-acetaminophen (PERCOCET/ROXICET) 5-325 MG per tablet Take 1 tablet by mouth every 4 (four) hours as needed (for pain scale less than 7). (Patient not taking: Reported on 12/19/2014) 30 tablet 0 Not Taking at Unknown time    ROS  (breastfeeding)  pumping 1-2 oz per breast Nipple pain and burning (uses coconut oil and Newman Nipple cream) Intermittent red spotting x 3 days (1-2 episodes per day) - passed clot and moderate bleeding this am No pain or cramping + constipation  Physical Exam   Blood pressure 133/79, pulse 117, temperature 98 F (36.7 C), temperature source Oral, resp. rate 17, height 5\' 7"  (1.702 m), weight 70.761 kg (156 lb), last menstrual period 12/17/2014, SpO2 100 %, currently breastfeeding.  Physical Exam  Alert and oriented - NAD or pain Abdomen soft and non-tender - uterus not palpable over pelvic brim Perineum - repair healing well - no evidence of infection or disruption in repair site                     scant dried blood / no odor / no active bleeding Bimanual - large amount of firm stool in rectum displacing vaginal floor                    cervix closed / no CMT                   uterus retroverted less than 12 week size - no evidence of subinvolution / non-tender  Additional concern for breast pain and burning around areola - only pumping / no latch. Right nipple & areola marked erythema with adherent yellow exudate in cracks and fissures Left nipple & areola with small amount  erythema with few cracks - no exudate. Lactation consultant in to examine and assist.                      Results for Ermalinda BarriosMARTIN, Zakyah (MRN [id number]) as of 12/19/2014 10:14  Ref. Range 12/19/2014 10:03  WBC Latest Range: 4.0-10.5 K/uL 7.1  RBC Latest Range: 3.87-5.11 MIL/uL 4.12  Hemoglobin Latest Range: 12.0-15.0 g/dL 40.912.3  HCT Latest Range: 36.0-46.0 % 37.4  MCV Latest Range: 78.0-100.0 fL 90.8  MCH Latest Range: 26.0-34.0 pg 29.9  MCHC Latest Range: 30.0-36.0 g/dL 81.132.9  RDW Latest Range: 11.5-15.5 % 13.1  Platelets Latest Range: 150-400 K/uL 272    MAU Course  Procedures  Orthostatic vital signs : 122/69 - 110/65 - 112/67 Pulse 87  Pelvic ultrasound:     CLINICAL DATA: Transvaginal delivery 11/17/2014 with persistent postpartum bleeding and passing clots. Initial encounter.  EXAM: TRANSABDOMINAL ULTRASOUND OF PELVIS  TECHNIQUE: Transabdominal ultrasound examination of the pelvis was performed including evaluation of the uterus, ovaries, adnexal regions, and pelvic cul-de-sac.  COMPARISON: None.  FINDINGS: Uterus  Measurements: Retroverted, measuring approximately 10.5 x 6.3 x 7.1 cm. No fibroids or other mass visualized.  Endometrium  Thickness: 2.3 cm. There is heterogeneous increased echogenicity throughout the endometrium with increased vascularity. No shadowing components identified.  Right ovary  Measurements: 3.0 x 2.3 x 2.4 cm. Normal appearance/no adnexal mass.  Left ovary  Measurements: 3.7 x 2.2 x 2.0 cm. Normal appearance/no adnexal mass.  Other findings: Trace free pelvic fluid.  IMPRESSION: 1. Thickened echogenic hypervascular endometrium concerning for retained products of conception. No echogenic components identified.  2. The uterus and ovaries otherwise appear unremarkable.      Assessment and Plan  4 weeks postpartum s/p SVD uncomplicated with 2nd degree repair Intermittent uterine bleeding - appropriate  sono Constipation  1) plan NSAIDs and Methergine course 2) start magnesium for constipation - increase water hydration - avoid straining with BM 3) no intercourse 4) breast pain / candidiasis with planned ABX course -  will treat with fluconazole  Consult with Dr Billy Coast to review ultrasound images - agree with plan for methergine and NSAID course - recommends additional ABX (Keflex) for 7 days with possible sub-clinical endometritis with late pre-term birth.  OV recheck in 2-3 days with Dr Verdene Lennert, TANYA 12/19/2014, 10:14 AM

## 2014-12-19 NOTE — Progress Notes (Signed)
Notified of pt in MAU, orders rcvd.

## 2015-09-29 ENCOUNTER — Ambulatory Visit (INDEPENDENT_AMBULATORY_CARE_PROVIDER_SITE_OTHER): Payer: 59 | Admitting: Osteopathic Medicine

## 2015-09-29 ENCOUNTER — Encounter: Payer: Self-pay | Admitting: Osteopathic Medicine

## 2015-09-29 ENCOUNTER — Ambulatory Visit (INDEPENDENT_AMBULATORY_CARE_PROVIDER_SITE_OTHER): Payer: 59

## 2015-09-29 VITALS — BP 124/60 | HR 72 | Temp 98.0°F | Wt 150.0 lb

## 2015-09-29 DIAGNOSIS — M9908 Segmental and somatic dysfunction of rib cage: Secondary | ICD-10-CM | POA: Insufficient documentation

## 2015-09-29 DIAGNOSIS — R0781 Pleurodynia: Secondary | ICD-10-CM | POA: Insufficient documentation

## 2015-09-29 MED ORDER — CYCLOBENZAPRINE HCL 5 MG PO TABS: 5.0000 mg | ORAL_TABLET | Freq: Three times a day (TID) | ORAL | Status: DC | PRN

## 2015-09-29 NOTE — Progress Notes (Signed)
HPI: Destiny Stewart is a 39 y.o. female who presents to [facility]  today for chief complaint of:  Chief Complaint  Patient presents with  . New Patient (Initial Visit)    right side pain    . Location: R side, lower ribs . Quality: usually ok but certain movements, coughing make it feel like sharp pain . Severity: mild to moderate . Duration: few weeks . Context: no injury but has been moving houses . Modifying factors: taken no meds - breastfeeding . Assoc signs/symptoms:  Recently getting over a cold, coughing some still   Past medical, social and family history reviewed: Past Medical History  Diagnosis Date  . Complication of anesthesia   . Postpartum care following vaginal delivery (12/23) 11/17/2014   Past Surgical History  Procedure Laterality Date  . Tonsillectomy    . Wisdom tooth extraction     Social History  Substance Use Topics  . Smoking status: Never Smoker   . Smokeless tobacco: Never Used  . Alcohol Use: No   No family history on file.  Current Outpatient Prescriptions  Medication Sig Dispense Refill  . docusate sodium (COLACE) 100 MG capsule Take 1 capsule (100 mg total) by mouth daily as needed for mild constipation. 10 capsule 0  . fluconazole (DIFLUCAN) 150 MG tablet Take 1 tablet (150 mg total) by mouth daily. 14 tablet 0  . ibuprofen (ADVIL,MOTRIN) 800 MG tablet Take 1 tablet (800 mg total) by mouth every 8 (eight) hours. Take around the clock x 3 days - then as needed if any spotting 15 tablet 0  . Magnesium 250 MG TABS Take 1-2 tablets (250-500 mg total) by mouth daily. 90 tablet 0  . OVER THE COUNTER MEDICATION Apply 1 application topically daily as needed (Nipple cream used as needed for inflammation.).    Marland Kitchen Prenatal Vit-Fe Fumarate-FA (PRENATAL MULTIVITAMIN) TABS tablet Take 1 tablet by mouth daily at 12 noon.     . methylergonovine (METHERGINE) 0.2 MG tablet Take 1 tablet (0.2 mg total) by mouth 3 (three) times  daily. x3 days (Patient not taking: Reported on 09/29/2015) 9 tablet 0   No current facility-administered medications for this visit.   Allergies  Allergen Reactions  . Penicillins Rash      Review of Systems: CONSTITUTIONAL:  No  fever, no chills, No  unintentional weight changes HEAD/EYES/EARS/NOSE/THROAT: No headache, no vision change, no hearing change, No  sore throat CARDIAC: No chest pain, no pressure/palpitations, no orthopnea RESPIRATORY: Some  cough, No  shortness of breath/wheeze GASTROINTESTINAL: No nausea, no vomiting, no abdominal pain, no blood in stool, no diarrhea, no constipation MUSCULOSKELETAL: Yes  Myalgia/arthralgia - rib pain as per HPI GENITOURINARY: No incontinence, No abnormal genital bleeding/discharge SKIN: No rash/wounds/concerning lesions HEM/ONC: No easy bruising/bleeding, no abnormal lymph node ENDOCRINE: No polyuria/polydipsia/polyphagia, no heat/cold intolerance  NEUROLOGIC: No weakness, no dizziness, no slurred speech PSYCHIATRIC: No concerns with depression, no concerns with anxiety, no sleep problems    Exam:  BP 124/60 mmHg  Pulse 72  Temp(Src) 98 F (36.7 C)  Wt 150 lb (68.04 kg) Constitutional: VSS, see above. General Appearance: alert, well-developed, well-nourished, NAD Musculoskeletal: Gait normal. No clubbing/cyanosis of digits. Tenderness on inhalation and exhalation of R floating ribs 9-10, symmetric respiratory motion all ribs, no tenerness paraspinal or sternal Neurological: No cranial nerve deficit on limited exam. Motor and sensation intact and symmetric   No results found for this or any previous visit (from the past 72 hour(s)).  ASSESSMENT/PLAN:  Rib pain on right side - If no improvement on Flexeril (caution with breastfeeding) and Ibuprofen, consider PT or repeat OMT trial. ?Occult PNA -> viscerosomatic pain r/o w CXR - Plan: DG Ribs Unilateral W/Chest Right, cyclobenzaprine (FLEXERIL) 5 MG tablet, advise OTC Ibuprofen  is preferred NSAID in breastfeeding. Note: child 6411 mos old, not exclusively breastfeeding   Rib cage region somatic dysfunction - OMT - myofascial release and muscle energy to R ribs to some relief, attempt HVLA unsuccessful.     Return if symptoms worsen or fail to improve, for ANNUAL PHYSICAL (at patient's convenience).

## 2017-02-28 ENCOUNTER — Encounter (HOSPITAL_COMMUNITY): Payer: Self-pay | Admitting: *Deleted

## 2017-02-28 ENCOUNTER — Other Ambulatory Visit: Payer: Self-pay | Admitting: Obstetrics and Gynecology

## 2017-02-28 DIAGNOSIS — Z419 Encounter for procedure for purposes other than remedying health state, unspecified: Secondary | ICD-10-CM

## 2017-03-01 ENCOUNTER — Encounter (HOSPITAL_COMMUNITY): Payer: Self-pay | Admitting: *Deleted

## 2017-03-01 ENCOUNTER — Ambulatory Visit (HOSPITAL_COMMUNITY): Payer: Commercial Managed Care - HMO | Admitting: Anesthesiology

## 2017-03-01 ENCOUNTER — Ambulatory Visit (HOSPITAL_COMMUNITY)
Admission: RE | Admit: 2017-03-01 | Discharge: 2017-03-01 | Disposition: A | Discharge status: Home / Self Care | Payer: Commercial Managed Care - HMO | Source: Ambulatory Visit | Attending: Obstetrics and Gynecology | Admitting: Obstetrics and Gynecology

## 2017-03-01 ENCOUNTER — Ambulatory Visit (HOSPITAL_COMMUNITY): Payer: Commercial Managed Care - HMO

## 2017-03-01 ENCOUNTER — Encounter (HOSPITAL_COMMUNITY)
Admission: RE | Disposition: A | Discharge status: Home / Self Care | Payer: Self-pay | Source: Ambulatory Visit | Attending: Obstetrics and Gynecology

## 2017-03-01 DIAGNOSIS — O021 Missed abortion: Secondary | ICD-10-CM | POA: Diagnosis not present

## 2017-03-01 HISTORY — PX: DILATION AND EVACUATION: SHX1459

## 2017-03-01 HISTORY — PX: OPERATIVE ULTRASOUND: SHX5996

## 2017-03-01 HISTORY — DX: Other seasonal allergic rhinitis: J30.2

## 2017-03-01 LAB — CBC
HCT: 43.6 % (ref 36.0–46.0)
Hemoglobin: 14.6 g/dL (ref 12.0–15.0)
MCH: 29.4 pg (ref 26.0–34.0)
MCHC: 33.5 g/dL (ref 30.0–36.0)
MCV: 87.7 fL (ref 78.0–100.0)
PLATELETS: 334 10*3/uL (ref 150–400)
RBC: 4.97 MIL/uL (ref 3.87–5.11)
RDW: 14 % (ref 11.5–15.5)
WBC: 10.5 10*3/uL (ref 4.0–10.5)

## 2017-03-01 SURGERY — DILATION AND EVACUATION, UTERUS
Anesthesia: Monitor Anesthesia Care | Site: Vagina

## 2017-03-01 MED ORDER — PROMETHAZINE HCL 25 MG/ML IJ SOLN: 6.2500 mg | INTRAMUSCULAR | Status: DC | PRN

## 2017-03-01 MED ORDER — ONDANSETRON HCL 4 MG/2ML IJ SOLN
INTRAMUSCULAR | Status: AC
  Filled 2017-03-01: qty 2

## 2017-03-01 MED ORDER — ONDANSETRON HCL 4 MG/2ML IJ SOLN
INTRAMUSCULAR | Status: DC | PRN
  Administered 2017-03-01: 4 mg via INTRAVENOUS

## 2017-03-01 MED ORDER — FENTANYL CITRATE (PF) 100 MCG/2ML IJ SOLN
INTRAMUSCULAR | Status: AC
  Filled 2017-03-01: qty 2

## 2017-03-01 MED ORDER — LACTATED RINGERS IV SOLN
INTRAVENOUS | Status: DC
  Administered 2017-03-01: 13:00:00 via INTRAVENOUS

## 2017-03-01 MED ORDER — SCOPOLAMINE 1 MG/3DAYS TD PT72
MEDICATED_PATCH | TRANSDERMAL | Status: AC
  Administered 2017-03-01: 1.5 mg via TRANSDERMAL
  Filled 2017-03-01: qty 1

## 2017-03-01 MED ORDER — CHLOROPROCAINE HCL 1 % IJ SOLN
INTRAMUSCULAR | Status: AC
  Filled 2017-03-01: qty 30

## 2017-03-01 MED ORDER — KETOROLAC TROMETHAMINE 30 MG/ML IJ SOLN: 30.0000 mg | Freq: Once | INTRAMUSCULAR | Status: DC | PRN

## 2017-03-01 MED ORDER — PROPOFOL 10 MG/ML IV BOLUS
INTRAVENOUS | Status: AC
  Filled 2017-03-01: qty 20

## 2017-03-01 MED ORDER — HYDROMORPHONE HCL 1 MG/ML IJ SOLN: 0.2500 mg | INTRAMUSCULAR | Status: DC | PRN

## 2017-03-01 MED ORDER — LIDOCAINE HCL (CARDIAC) 20 MG/ML IV SOLN
INTRAVENOUS | Status: AC
  Filled 2017-03-01: qty 5

## 2017-03-01 MED ORDER — DEXAMETHASONE SODIUM PHOSPHATE 10 MG/ML IJ SOLN
INTRAMUSCULAR | Status: AC
  Filled 2017-03-01: qty 1

## 2017-03-01 MED ORDER — MIDAZOLAM HCL 2 MG/2ML IJ SOLN
INTRAMUSCULAR | Status: AC
  Filled 2017-03-01: qty 2

## 2017-03-01 MED ORDER — KETOROLAC TROMETHAMINE 30 MG/ML IJ SOLN
INTRAMUSCULAR | Status: AC
  Filled 2017-03-01: qty 1

## 2017-03-01 MED ORDER — PROPOFOL 10 MG/ML IV BOLUS
INTRAVENOUS | Status: DC | PRN
  Administered 2017-03-01: 170 mg via INTRAVENOUS
  Administered 2017-03-01: 30 mg via INTRAVENOUS

## 2017-03-01 MED ORDER — MIDAZOLAM HCL 5 MG/5ML IJ SOLN
INTRAMUSCULAR | Status: DC | PRN
  Administered 2017-03-01: 2 mg via INTRAVENOUS

## 2017-03-01 MED ORDER — DOXYCYCLINE HYCLATE 50 MG PO CAPS: 100.0000 mg | ORAL_CAPSULE | Freq: Two times a day (BID) | ORAL | 0 refills | Status: AC

## 2017-03-01 MED ORDER — KETOROLAC TROMETHAMINE 30 MG/ML IJ SOLN
INTRAMUSCULAR | Status: DC | PRN
  Administered 2017-03-01: 30 mg via INTRAVENOUS
  Administered 2017-03-01: 30 mg via INTRAMUSCULAR

## 2017-03-01 MED ORDER — DEXAMETHASONE SODIUM PHOSPHATE 10 MG/ML IJ SOLN
INTRAMUSCULAR | Status: DC | PRN
  Administered 2017-03-01: 10 mg via INTRAVENOUS

## 2017-03-01 MED ORDER — DOXYCYCLINE HYCLATE 100 MG IV SOLR
100.0000 mg | Freq: Two times a day (BID) | INTRAVENOUS | Status: DC
  Administered 2017-03-01: 100 mg via INTRAVENOUS
  Filled 2017-03-01 (×3): qty 100

## 2017-03-01 MED ORDER — LIDOCAINE 2% (20 MG/ML) 5 ML SYRINGE
INTRAMUSCULAR | Status: DC | PRN
  Administered 2017-03-01: 80 mg via INTRAVENOUS

## 2017-03-01 MED ORDER — FENTANYL CITRATE (PF) 100 MCG/2ML IJ SOLN
INTRAMUSCULAR | Status: DC | PRN
  Administered 2017-03-01 (×2): 50 ug via INTRAVENOUS

## 2017-03-01 MED ORDER — SCOPOLAMINE 1 MG/3DAYS TD PT72
1.0000 | MEDICATED_PATCH | Freq: Once | TRANSDERMAL | Status: DC
  Administered 2017-03-01: 1.5 mg via TRANSDERMAL

## 2017-03-01 SURGICAL SUPPLY — 18 items
CATH ROBINSON RED A/P 16FR (CATHETERS) ×4 IMPLANT
CLOTH BEACON ORANGE TIMEOUT ST (SAFETY) ×4 IMPLANT
DECANTER SPIKE VIAL GLASS SM (MISCELLANEOUS) ×4 IMPLANT
GLOVE BIOGEL PI IND STRL 7.0 (GLOVE) ×4 IMPLANT
GLOVE BIOGEL PI INDICATOR 7.0 (GLOVE) ×4
GLOVE ECLIPSE 6.5 STRL STRAW (GLOVE) ×4 IMPLANT
GOWN STRL REUS W/TWL LRG LVL3 (GOWN DISPOSABLE) ×8 IMPLANT
KIT BERKELEY 1ST TRIMESTER 3/8 (MISCELLANEOUS) ×4 IMPLANT
NS IRRIG 1000ML POUR BTL (IV SOLUTION) ×4 IMPLANT
PACK VAGINAL MINOR WOMEN LF (CUSTOM PROCEDURE TRAY) ×4 IMPLANT
PAD OB MATERNITY 4.3X12.25 (PERSONAL CARE ITEMS) ×4 IMPLANT
PAD PREP 24X48 CUFFED NSTRL (MISCELLANEOUS) ×4 IMPLANT
SET BERKELEY SUCTION TUBING (SUCTIONS) ×4 IMPLANT
TOWEL OR 17X24 6PK STRL BLUE (TOWEL DISPOSABLE) ×8 IMPLANT
VACURETTE 10 RIGID CVD (CANNULA) IMPLANT
VACURETTE 7MM CVD STRL WRAP (CANNULA) ×4 IMPLANT
VACURETTE 8 RIGID CVD (CANNULA) IMPLANT
VACURETTE 9 RIGID CVD (CANNULA) IMPLANT

## 2017-03-01 NOTE — Anesthesia Procedure Notes (Signed)
Procedure Name: LMA Insertion Date/Time: 03/01/2017 1:28 PM Performed by: Jhonnie Garner Pre-anesthesia Checklist: Patient identified, Emergency Drugs available, Suction available and Patient being monitored Patient Re-evaluated:Patient Re-evaluated prior to inductionOxygen Delivery Method: Circle system utilized Preoxygenation: Pre-oxygenation with 100% oxygen Intubation Type: IV induction Ventilation: Mask ventilation without difficulty LMA: LMA inserted LMA Size: 4.0 Number of attempts: 1 Placement Confirmation: positive ETCO2 and breath sounds checked- equal and bilateral Tube secured with: Tape Dental Injury: Teeth and Oropharynx as per pre-operative assessment

## 2017-03-01 NOTE — Discharge Instructions (Signed)
DISCHARGE INSTRUCTIONS: D&C / D&E The following instructions have been prepared to help you care for yourself upon your return home.   Personal hygiene:  Use sanitary pads for vaginal drainage, not tampons.  Shower the day after your procedure.  NO tub baths, pools or Jacuzzis for 2-3 weeks.  Wipe front to back after using the bathroom.  Activity and limitations:  Do NOT drive or operate any equipment for 24 hours. The effects of anesthesia are still present and drowsiness may result.  Do NOT rest in bed all day.  Walking is encouraged.  Walk up and down stairs slowly.  You may resume your normal activity in one to two days or as indicated by your physician.  Sexual activity: NO intercourse for at least 2 weeks after the procedure, or as indicated by your physician.  Diet: Eat a light meal as desired this evening. You may resume your usual diet tomorrow.  Return to work: You may resume your work activities in one to two days or as indicated by your doctor.  What to expect after your surgery: Expect to have vaginal bleeding/discharge for 2-3 days and spotting for up to 10 days. It is not unusual to have soreness for up to 1-2 weeks. You may have a slight burning sensation when you urinate for the first day. Mild cramps may continue for a couple of days. You may have a regular period in 2-6 weeks.  Call your doctor for any of the following:  Excessive vaginal bleeding, saturating and changing one pad every hour.  Inability to urinate 6 hours after discharge from hospital.  Pain not relieved by pain medication.  Fever of 100.4 F or greater.  Unusual vaginal discharge or odor.   Call for an appointment:    Patients signature: ______________________  Nurses signature ________________________  Support person's signature_______________________    Post Anesthesia Home Care Instructions  Activity: Get plenty of rest for the remainder of the day. A responsible  individual must stay with you for 24 hours following the procedure.  For the next 24 hours, DO NOT: -Drive a car -Advertising copywriter -Drink alcoholic beverages -Take any medication unless instructed by your physician -Make any legal decisions or sign important papers.  Meals: Start with liquid foods such as gelatin or soup. Progress to regular foods as tolerated. Avoid greasy, spicy, heavy foods. If nausea and/or vomiting occur, drink only clear liquids until the nausea and/or vomiting subsides. Call your physician if vomiting continues.  Special Instructions/Symptoms: Your throat may feel dry or sore from the anesthesia or the breathing tube placed in your throat during surgery. If this causes discomfort, gargle with warm salt water. The discomfort should disappear within 24 hours.  If you had a scopolamine patch placed behind your ear for the management of post- operative nausea and/or vomiting:  1. The medication in the patch is effective for 72 hours, after which it should be removed.  Wrap patch in a tissue and discard in the trash. Wash hands thoroughly with soap and water. 2. You may remove the patch earlier than 72 hours if you experience unpleasant side effects which may include dry mouth, dizziness or visual disturbances. 3. Avoid touching the patch. Wash your hands with soap and water after contact with the patch.   CALL  IF TEMP>100.4, NOTHING PER VAGINA X 2 WK, CALL IF SOAKING A MAXI  PAD EVERY HOUR OR MORE FREQUENTLY

## 2017-03-01 NOTE — Anesthesia Preprocedure Evaluation (Addendum)
Anesthesia Evaluation  Patient identified by MRN, date of birth, ID band Patient awake    Reviewed: Allergy & Precautions, NPO status , Patient's Chart, lab work & pertinent test results  Airway Mallampati: I  TM Distance: >3 FB Neck ROM: Full    Dental  (+) Dental Advisory Given   Pulmonary neg pulmonary ROS,    breath sounds clear to auscultation       Cardiovascular negative cardio ROS   Rhythm:Regular Rate:Normal     Neuro/Psych negative neurological ROS     GI/Hepatic negative GI ROS, Neg liver ROS,   Endo/Other  negative endocrine ROS  Renal/GU negative Renal ROS     Musculoskeletal negative musculoskeletal ROS (+)   Abdominal   Peds  Hematology negative hematology ROS (+)   Anesthesia Other Findings   Reproductive/Obstetrics                            Lab Results  Component Value Date   WBC 7.1 12/19/2014   HGB 12.3 12/19/2014   HCT 37.4 12/19/2014   MCV 90.8 12/19/2014   PLT 272 12/19/2014    Anesthesia Physical Anesthesia Plan  ASA: I  Anesthesia Plan: General   Post-op Pain Management:    Induction: Intravenous  Airway Management Planned: LMA  Additional Equipment:   Intra-op Plan:   Post-operative Plan: Extubation in OR  Informed Consent: I have reviewed the patients History and Physical, chart, labs and discussed the procedure including the risks, benefits and alternatives for the proposed anesthesia with the patient or authorized representative who has indicated his/her understanding and acceptance.     Plan Discussed with: CRNA  Anesthesia Plan Comments:        Anesthesia Quick Evaluation

## 2017-03-01 NOTE — Anesthesia Postprocedure Evaluation (Signed)
Anesthesia Post Note  Patient: Destiny Stewart  Procedure(s) Performed: Procedure(s) (LRB): DILATATION AND EVACUATION (N/A) OPERATIVE ULTRASOUND (N/A)  Patient location during evaluation: PACU Anesthesia Type: MAC Level of consciousness: awake and alert Pain management: pain level controlled Vital Signs Assessment: post-procedure vital signs reviewed and stable Respiratory status: spontaneous breathing, nonlabored ventilation, respiratory function stable and patient connected to nasal cannula oxygen Cardiovascular status: blood pressure returned to baseline and stable Postop Assessment: no signs of nausea or vomiting Anesthetic complications: no        Last Vitals:  Vitals:   03/01/17 1515 03/01/17 1530  BP: (!) 104/52 (!) 107/56  Pulse: 71 83  Resp: 13 16  Temp:  37.3 C    Last Pain:  Vitals:   03/01/17 1530  TempSrc:   PainSc: 2    Pain Goal: Patients Stated Pain Goal: 0 (03/01/17 1530)               Tiajuana Amass

## 2017-03-01 NOTE — Transfer of Care (Signed)
Immediate Anesthesia Transfer of Care Note  Patient: Destiny Stewart  Procedure(s) Performed: Procedure(s): DILATATION AND EVACUATION (N/A) OPERATIVE ULTRASOUND (N/A)  Patient Location: PACU  Anesthesia Type:General  Level of Consciousness:  sedated, patient cooperative and responds to stimulation  Airway & Oxygen Therapy:Patient Spontanous Breathing and Patient connected to face mask oxgen  Post-op Assessment:  Report given to PACU RN and Post -op Vital signs reviewed and stable  Post vital signs:  Reviewed and stable  Last Vitals:  Vitals:   03/01/17 1240  BP: (!) 108/59  Resp: 16  Temp: 36.8 C    Complications: No apparent anesthesia complications

## 2017-03-01 NOTE — H&P (Signed)
Destiny Stewart is an 41 y.o. female.[id number] MWF with missed AB dx 3/8 failed cytotec here for surgical mgmt. (+) vaginal bleeding  Pertinent Gynecological History: Menses: regular Bleeding: reg Contraception: none DES exposure: unknown Blood transfusions: none Sexually transmitted diseases: no past history Previous GYN Procedures none  OB History: G3P1   Menstrual History: Menarche age: n/a No LMP recorded. Patient is pregnant.    Past Medical History:  Diagnosis Date  . Complication of anesthesia   . Postpartum care following vaginal delivery (12/23) 11/17/2014  . Seasonal allergies   . SVD (spontaneous vaginal delivery)    x 1    Past Surgical History:  Procedure Laterality Date  . TONSILLECTOMY    . WISDOM TOOTH EXTRACTION      Family History  Problem Relation Age of Onset  . Diabetes Father   . Depression Father   . Depression Brother   . Diabetes Maternal Grandmother   . Heart disease Paternal Grandfather     Social History:  reports that she has never smoked. She has never used smokeless tobacco. She reports that she drinks alcohol. She reports that she does not use drugs.  Allergies:  Allergies  Allergen Reactions  . Penicillins Rash    As a child Has patient had a PCN reaction causing immediate rash, facial/tongue/throat swelling, SOB or lightheadedness with hypotension: Unknown Has patient had a PCN reaction causing severe rash involving mucus membranes or skin necrosis: Uknown Has patient had a PCN reaction that required hospitalization: Unknown Has patient had a PCN reaction occurring within the last 10 years: No If all of the above answers are "NO", then may proceed with Cephalosporin use.     No prescriptions prior to admission.    Review of Systems  All other systems reviewed and are negative.   Height  (1.702 m), weight 68 kg (150 lb), not currently breastfeeding. Physical Exam  Constitutional: She is oriented to person, place, and  time. She appears well-developed and well-nourished.  HENT:  Head: Atraumatic.  Eyes: Pupils are equal, round, and reactive to light.  Neck: Normal range of motion.  Cardiovascular: Normal rate and regular rhythm.   Respiratory: Breath sounds normal.  GI: Soft.  Genitourinary: Vagina normal.  Genitourinary Comments: 8 weeks AV no adnexal mass Vulva nl  Musculoskeletal: She exhibits no edema.  Neurological: She is alert and oriented to person, place, and time.  Skin: Skin is dry.    No results found for this or any previous visit (from the past 24 hour(s)).  No results found.  Assessment/Plan: Missed ab P) u/s guided suction D&E. Risk of surgery reviewed including infection, bleeding, Injury to surrounding organ structures, uterine perforation and its risk, internal scar tissue. All ? answered  Cora Brierley A 03/01/2017, 5:49 AM

## 2017-03-01 NOTE — Brief Op Note (Signed)
03/01/2017  1:59 PM  PATIENT:  Destiny Stewart  41 y.o. female  PRE-OPERATIVE DIAGNOSIS:  Missed Abortion  POST-OPERATIVE DIAGNOSIS:  Missed Abortion /incomplete abortion  PROCEDURE:  Ultrasound guided suction Dilation and evacuation  SURGEON:  Surgeon(s) and Role:    * Maxie Better, MD - Primary  PHYSICIAN ASSISTANT:   ASSISTANTS: none   ANESTHESIA:   general Findings: tissue in the fundal area, large multicystic ovarian cyst EBL:  Total I/O In: 700 [I.V.:700] Out: 130 [Urine:30; Blood:100]  BLOOD ADMINISTERED:none  DRAINS: none   LOCAL MEDICATIONS USED:  NONE  SPECIMEN:  Source of Specimen:  POC  DISPOSITION OF SPECIMEN:  PATHOLOGY  COUNTS:  YES  TOURNIQUET:  * No tourniquets in log *  DICTATION: .Other Dictation: Dictation Number [id number]  PLAN OF CARE: Discharge to home after PACU  PATIENT DISPOSITION:  PACU - hemodynamically stable.   Delay start of Pharmacological VTE agent (>24hrs) due to surgical blood loss or risk of bleeding: no

## 2017-03-02 ENCOUNTER — Encounter (HOSPITAL_COMMUNITY): Payer: Self-pay | Admitting: Obstetrics and Gynecology

## 2017-03-04 NOTE — Op Note (Signed)
NAME:  Destiny Stewart, Destiny Stewart                     ACCOUNT NO.:  MEDICAL RECORD NO.:  [id number]  LOCATION:                                 FACILITY:  PHYSICIAN:  Maxie Better, M.D.    DATE OF BIRTH:  DATE OF PROCEDURE:  03/01/2017 DATE OF DISCHARGE:                              OPERATIVE REPORT   PREOPERATIVE DIAGNOSIS:  Missed abortion.  POSTOPERATIVE DIAGNOSIS:  Missed abortion/incomplete Spontaneous abortion.  PROCEDURE:  Ultrasound-guided suction, dilation, and evacuation.  ANESTHESIA:  General.  SURGEON:  Maxie Better, M.D.  ASSISTANT:  None.  DESCRIPTION OF PROCEDURE:  Under adequate general anesthesia, the patient was placed in a dorsal lithotomy position.  She was sterilely prepped and draped in the usual fashion.  The bladder was not catheterized in anticipation of the ultrasound guidance.  With ultrasound guidance, the bivalve speculum was placed in vagina.  Single- tooth tenaculum was placed on the anterior lip of the cervix.  The cervix easily dilated up to #25 [employer] dilator.  A #7 mm curved suction cannula was introduced into the uterine cavity, for which products of conception was noted particularly in the fundal region.  Once these products were removed, the cavity was gently curetted and suctioned. When all tissue had been removed, all instruments were then removed from the vagina.  Of note, there was incidental multicystic ovary noted.  SPECIMENS:  Products of conception sent to Pathology.  ESTIMATED BLOOD LOSS:  Minimal.  COMPLICATION:  None.  The patient tolerated the procedure well, was transferred to recovery in stable condition.     Maxie Better, M.D.     [place]/[facility]  D:  03/01/2017  T:  03/02/2017  Job:  [id number]

## 2017-05-07 ENCOUNTER — Other Ambulatory Visit: Payer: Self-pay | Admitting: Obstetrics and Gynecology

## 2018-12-25 DIAGNOSIS — B351 Tinea unguium: Secondary | ICD-10-CM | POA: Diagnosis not present

## 2019-01-08 DIAGNOSIS — M5432 Sciatica, left side: Secondary | ICD-10-CM | POA: Diagnosis not present

## 2019-02-03 DIAGNOSIS — Z1321 Encounter for screening for nutritional disorder: Secondary | ICD-10-CM | POA: Diagnosis not present

## 2019-02-03 DIAGNOSIS — Z3141 Encounter for fertility testing: Secondary | ICD-10-CM | POA: Diagnosis not present

## 2019-02-03 DIAGNOSIS — N96 Recurrent pregnancy loss: Secondary | ICD-10-CM | POA: Diagnosis not present

## 2019-02-03 DIAGNOSIS — Z13228 Encounter for screening for other metabolic disorders: Secondary | ICD-10-CM | POA: Diagnosis not present

## 2019-02-04 DIAGNOSIS — N96 Recurrent pregnancy loss: Secondary | ICD-10-CM | POA: Diagnosis not present

## 2019-02-12 ENCOUNTER — Other Ambulatory Visit: Payer: Self-pay | Admitting: Obstetrics and Gynecology

## 2019-02-12 DIAGNOSIS — M5430 Sciatica, unspecified side: Secondary | ICD-10-CM

## 2019-04-09 DIAGNOSIS — M6281 Muscle weakness (generalized): Secondary | ICD-10-CM | POA: Diagnosis not present

## 2019-04-09 DIAGNOSIS — M79605 Pain in left leg: Secondary | ICD-10-CM | POA: Diagnosis not present

## 2019-04-14 DIAGNOSIS — M79605 Pain in left leg: Secondary | ICD-10-CM | POA: Diagnosis not present

## 2019-04-14 DIAGNOSIS — M6281 Muscle weakness (generalized): Secondary | ICD-10-CM | POA: Diagnosis not present

## 2019-04-16 DIAGNOSIS — M6281 Muscle weakness (generalized): Secondary | ICD-10-CM | POA: Diagnosis not present

## 2019-04-16 DIAGNOSIS — M79605 Pain in left leg: Secondary | ICD-10-CM | POA: Diagnosis not present

## 2019-04-23 DIAGNOSIS — M6281 Muscle weakness (generalized): Secondary | ICD-10-CM | POA: Diagnosis not present

## 2019-04-23 DIAGNOSIS — M79605 Pain in left leg: Secondary | ICD-10-CM | POA: Diagnosis not present

## 2019-04-25 ENCOUNTER — Other Ambulatory Visit: Payer: Self-pay

## 2019-04-25 ENCOUNTER — Ambulatory Visit
Admission: RE | Admit: 2019-04-25 | Discharge: 2019-04-25 | Disposition: A | Discharge status: Home / Self Care | Payer: 59 | Source: Ambulatory Visit | Attending: Obstetrics and Gynecology | Admitting: Obstetrics and Gynecology

## 2019-04-25 DIAGNOSIS — M5430 Sciatica, unspecified side: Secondary | ICD-10-CM

## 2019-04-25 DIAGNOSIS — R600 Localized edema: Secondary | ICD-10-CM | POA: Diagnosis not present

## 2019-04-25 MED ORDER — GADOBENATE DIMEGLUMINE 529 MG/ML IV SOLN
15.0000 mL | Freq: Once | INTRAVENOUS | Status: AC | PRN
  Administered 2019-04-25: 15 mL via INTRAVENOUS

## 2019-04-29 ENCOUNTER — Other Ambulatory Visit: Payer: Self-pay | Admitting: Obstetrics and Gynecology

## 2019-04-29 DIAGNOSIS — M5430 Sciatica, unspecified side: Secondary | ICD-10-CM

## 2019-04-30 DIAGNOSIS — M79605 Pain in left leg: Secondary | ICD-10-CM | POA: Diagnosis not present

## 2019-04-30 DIAGNOSIS — M6281 Muscle weakness (generalized): Secondary | ICD-10-CM | POA: Diagnosis not present

## 2019-05-07 DIAGNOSIS — M79605 Pain in left leg: Secondary | ICD-10-CM | POA: Diagnosis not present

## 2019-05-07 DIAGNOSIS — M6281 Muscle weakness (generalized): Secondary | ICD-10-CM | POA: Diagnosis not present

## 2019-05-14 DIAGNOSIS — M6281 Muscle weakness (generalized): Secondary | ICD-10-CM | POA: Diagnosis not present

## 2019-05-14 DIAGNOSIS — M79605 Pain in left leg: Secondary | ICD-10-CM | POA: Diagnosis not present

## 2019-06-11 DIAGNOSIS — N809 Endometriosis, unspecified: Secondary | ICD-10-CM | POA: Diagnosis not present

## 2019-06-11 DIAGNOSIS — N9489 Other specified conditions associated with female genital organs and menstrual cycle: Secondary | ICD-10-CM | POA: Diagnosis not present

## 2019-06-11 DIAGNOSIS — M5432 Sciatica, left side: Secondary | ICD-10-CM | POA: Diagnosis not present

## 2019-07-02 ENCOUNTER — Ambulatory Visit: Payer: BLUE CROSS/BLUE SHIELD | Admitting: Physical Therapy

## 2019-07-09 ENCOUNTER — Ambulatory Visit: Payer: BLUE CROSS/BLUE SHIELD | Admitting: Physical Therapy

## 2019-10-08 DIAGNOSIS — Z131 Encounter for screening for diabetes mellitus: Secondary | ICD-10-CM | POA: Diagnosis not present

## 2019-10-08 DIAGNOSIS — Z1231 Encounter for screening mammogram for malignant neoplasm of breast: Secondary | ICD-10-CM | POA: Diagnosis not present

## 2019-10-08 DIAGNOSIS — Z0000 Encounter for general adult medical examination without abnormal findings: Secondary | ICD-10-CM | POA: Diagnosis not present

## 2019-10-08 DIAGNOSIS — Z1151 Encounter for screening for human papillomavirus (HPV): Secondary | ICD-10-CM | POA: Diagnosis not present

## 2019-10-08 DIAGNOSIS — Z130 Encounter for screening for diseases of the blood and blood-forming organs and certain disorders involving the immune mechanism: Secondary | ICD-10-CM | POA: Diagnosis not present

## 2019-10-08 DIAGNOSIS — Z6825 Body mass index (BMI) 25.0-25.9, adult: Secondary | ICD-10-CM | POA: Diagnosis not present

## 2019-10-08 DIAGNOSIS — Z13220 Encounter for screening for lipoid disorders: Secondary | ICD-10-CM | POA: Diagnosis not present

## 2019-10-08 DIAGNOSIS — Z01419 Encounter for gynecological examination (general) (routine) without abnormal findings: Secondary | ICD-10-CM | POA: Diagnosis not present

## 2019-10-08 DIAGNOSIS — Z124 Encounter for screening for malignant neoplasm of cervix: Secondary | ICD-10-CM | POA: Diagnosis not present

## 2019-10-08 DIAGNOSIS — Z1329 Encounter for screening for other suspected endocrine disorder: Secondary | ICD-10-CM | POA: Diagnosis not present

## 2019-11-17 DIAGNOSIS — J209 Acute bronchitis, unspecified: Secondary | ICD-10-CM | POA: Diagnosis not present

## 2020-09-04 IMAGING — MR MRI PELVIS WITHOUT AND WITH CONTRAST
5 of 8 series · 29 of 48 positions shown · IV contrast (15 ml Multihance)
Comparison: None.

CLINICAL DATA: Intermittent left buttock and leg pain menses in a
patient with a history of endometriosis.

EXAM:
MRI PELVIS WITHOUT AND WITH CONTRAST
TECHNIQUE: Multiplanar, multisequence MR imaging of the pelvis was performed
both before and after administration of intravenous contrast.
CONTRAST:  15 mL MULTIHANCE GADOBENATE DIMEGLUMINE 529 MG/ML IV SOLN

[Series 3: STIR · coronal · 4.0mm · 0.70mm/px · 5 of 31 slices shown]
[im 1/31]
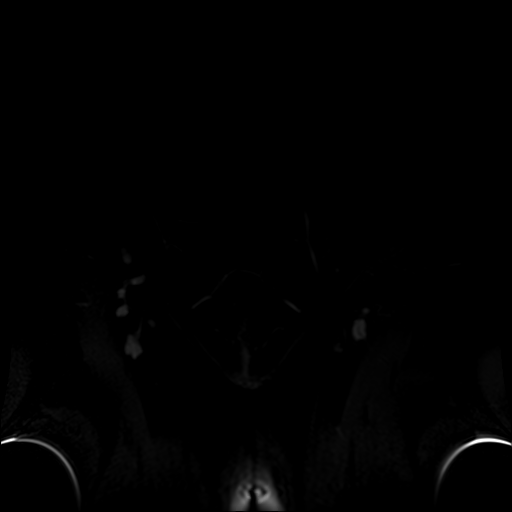
[im 8/31]
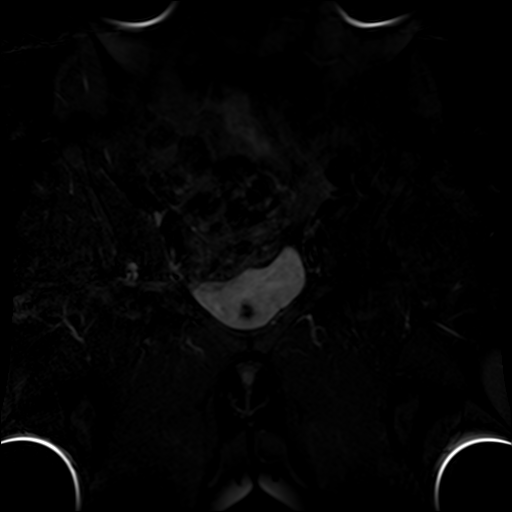
[im 16/31]
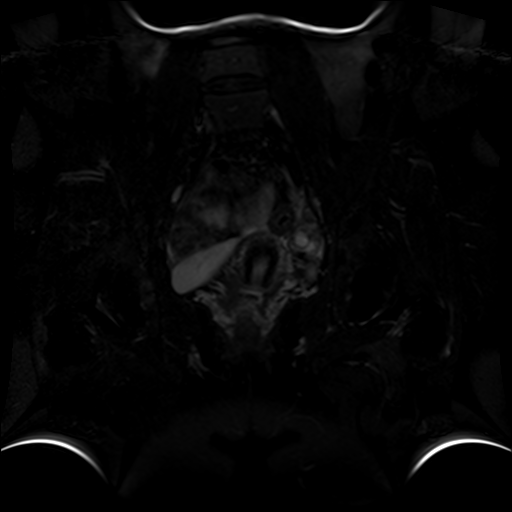
[im 23/31]
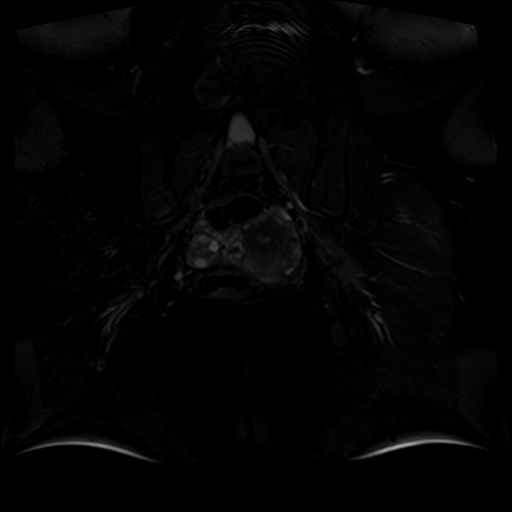
[im 31/31]
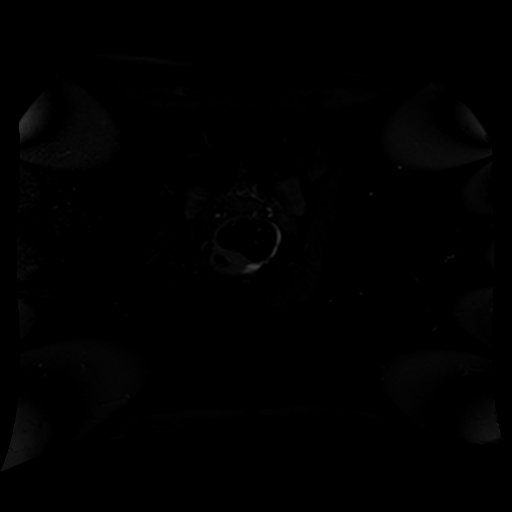

[Series 4: T1 · coronal · 4.0mm · 1.12mm/px · 5 of 31 slices shown (1 of 2)]
[im 1/31]
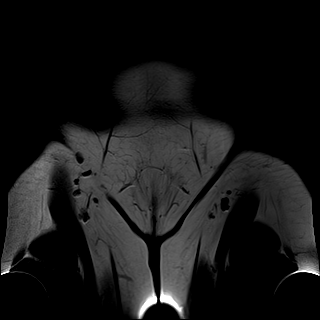
[im 8/31]
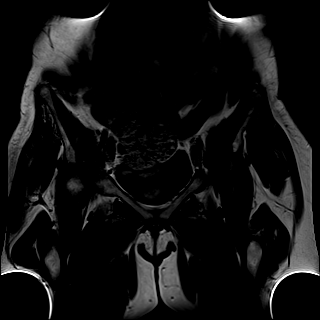
[im 16/31]
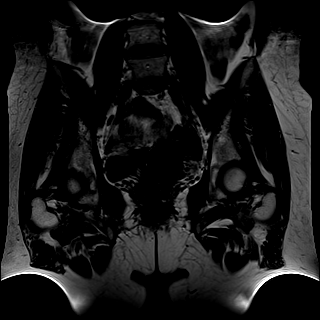
[im 23/31]
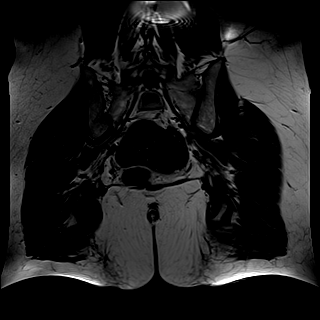
[im 31/31]
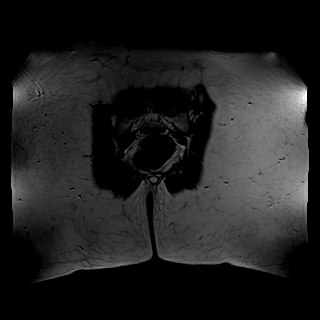

[Series 5: T1 · axial · 4.0mm · 1.00mm/px · z∈[-174,+53]mm · 6 of 43 slices shown (2 of 2)]
[im 1/43]
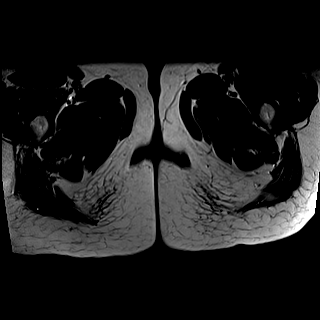
[im 9/43]
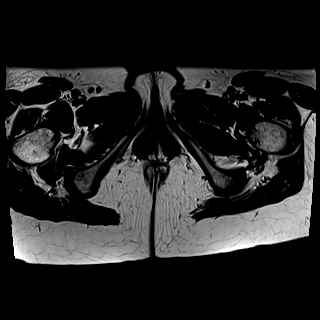
[im 17/43]
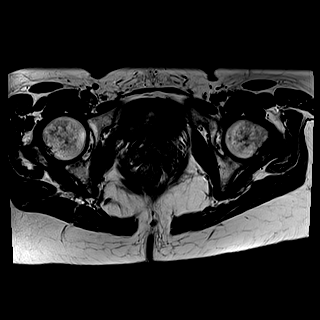
[im 26/43]
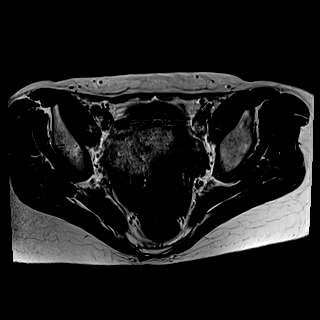
[im 34/43]
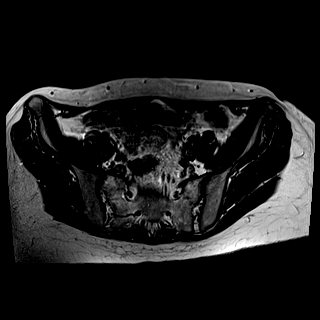
[im 43/43]
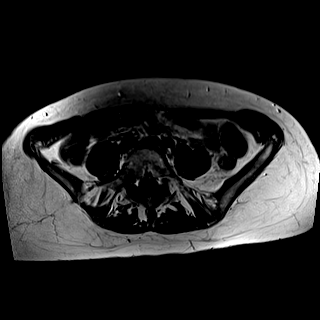

[Series 6: T2 fat-sat · axial · 4.0mm · 1.00mm/px · z∈[-180,+47]mm · 6 of 43 slices shown (1 of 2)]
[im 1/43]
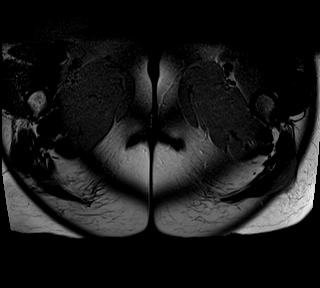
[im 9/43]
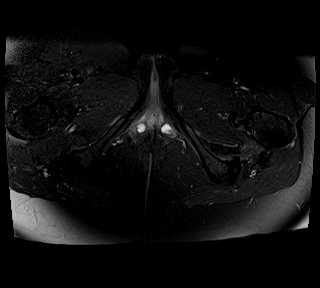
[im 17/43]
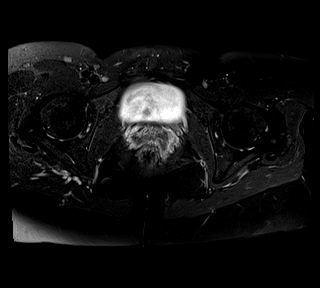
[im 26/43]
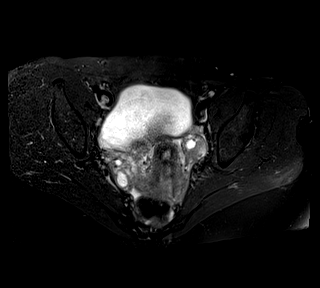
[im 34/43]
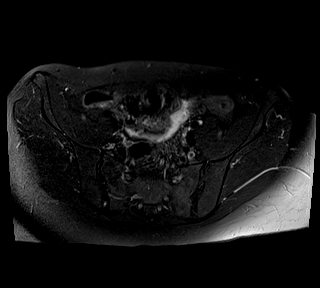
[im 43/43]
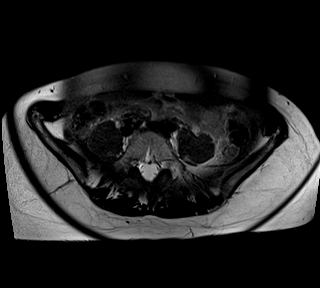

[Series 7: T2 fat-sat · sagittal · 4.0mm · 0.51mm/px · 7 of 59 slices shown (2 of 2)]
[im 1/59]
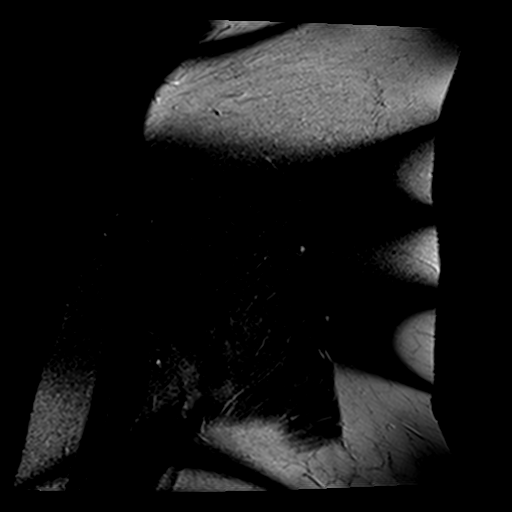
[im 8/59]
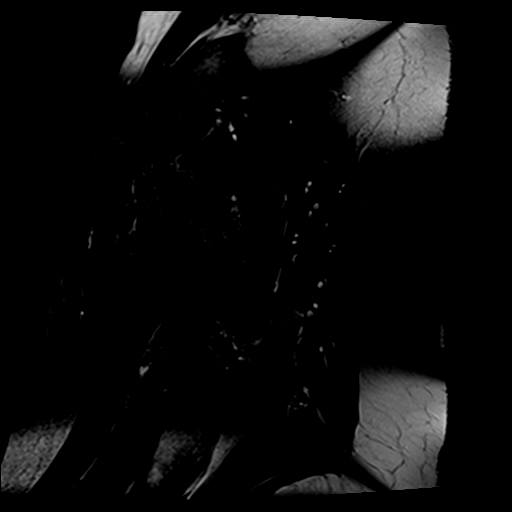
[im 15/59]
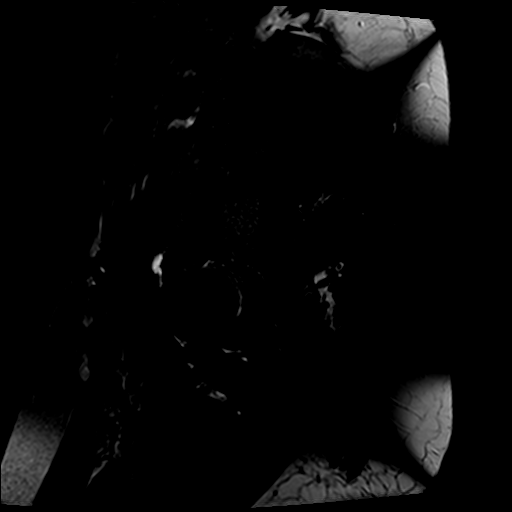
[im 22/59]
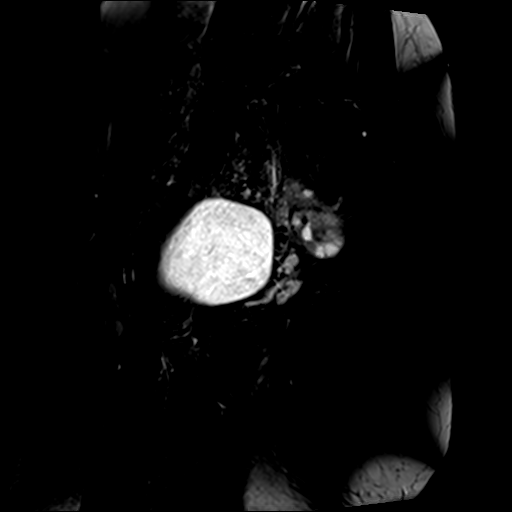
[im 37/59]
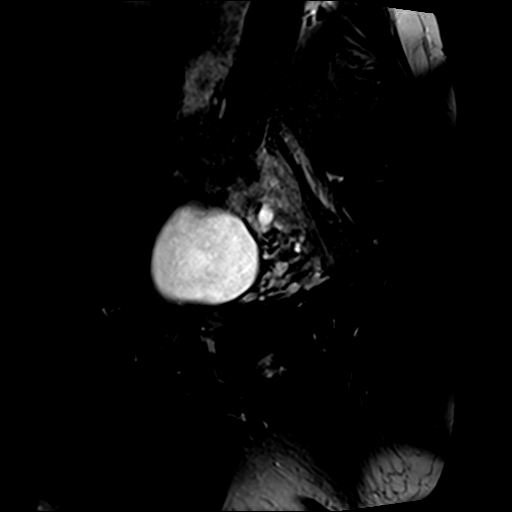
[im 44/59]
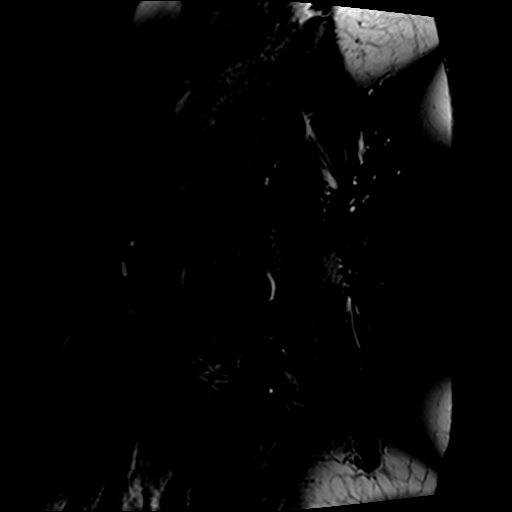
[im 51/59]
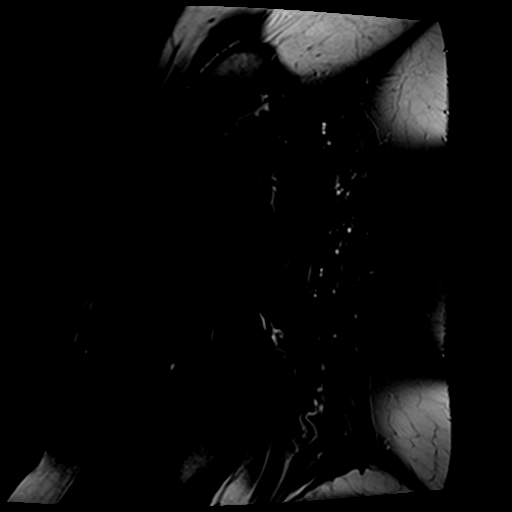

[29 of 48 positions shown; findings below may reference images not displayed]

FINDINGS: Bones/Joint/Cartilage

Marrow signal is normal throughout.

Ligaments

Normal.

Muscles and Tendons

Intact. Mild edema is seen in the periphery of the left gluteus
maximus.

Soft tissues

The left sciatic nerve is irregularly thickened and enhancing
adjacent to the left side of the uterus, through the sciatic notch
and as it passes posterior to the left acetabulum. Thickening and
enhancement are most notable in the sciatic notch. The left L5 root
appears mildly enhancing in the left foramen but this is thought due
to lack of fat saturation. No impingement on the root is identified.
The sacral nerve roots are unremarkable.
IMPRESSION: Thickening and abnormal enhancement of the left sciatic nerve as
described are most consistent endometriosis given the patient's
history.

Mild edema in the periphery of the left gluteus medius is consistent
with early denervation change.

Somewhat increased signal in the left L5 root on postcontrast
imaging is thought to be artifactual due to loss of saturation.

## 2022-02-07 DIAGNOSIS — Z1231 Encounter for screening mammogram for malignant neoplasm of breast: Secondary | ICD-10-CM | POA: Diagnosis not present

## 2022-08-08 DIAGNOSIS — Z01419 Encounter for gynecological examination (general) (routine) without abnormal findings: Secondary | ICD-10-CM | POA: Diagnosis not present

## 2022-08-31 DIAGNOSIS — Z23 Encounter for immunization: Secondary | ICD-10-CM | POA: Diagnosis not present

## 2022-09-19 DIAGNOSIS — Z1211 Encounter for screening for malignant neoplasm of colon: Secondary | ICD-10-CM | POA: Diagnosis not present

## 2022-10-09 DIAGNOSIS — Z1211 Encounter for screening for malignant neoplasm of colon: Secondary | ICD-10-CM | POA: Diagnosis not present

## 2023-06-11 DIAGNOSIS — Z1231 Encounter for screening mammogram for malignant neoplasm of breast: Secondary | ICD-10-CM | POA: Diagnosis not present

## 2023-08-12 DIAGNOSIS — Z01419 Encounter for gynecological examination (general) (routine) without abnormal findings: Secondary | ICD-10-CM | POA: Diagnosis not present

## 2023-08-12 DIAGNOSIS — Z131 Encounter for screening for diabetes mellitus: Secondary | ICD-10-CM | POA: Diagnosis not present

## 2023-08-12 DIAGNOSIS — Z0000 Encounter for general adult medical examination without abnormal findings: Secondary | ICD-10-CM | POA: Diagnosis not present

## 2023-08-12 DIAGNOSIS — Z13220 Encounter for screening for lipoid disorders: Secondary | ICD-10-CM | POA: Diagnosis not present

## 2023-08-12 DIAGNOSIS — Z309 Encounter for contraceptive management, unspecified: Secondary | ICD-10-CM | POA: Diagnosis not present

## 2024-09-24 ENCOUNTER — Emergency Department (HOSPITAL_BASED_OUTPATIENT_CLINIC_OR_DEPARTMENT_OTHER)

## 2024-09-24 ENCOUNTER — Encounter (HOSPITAL_BASED_OUTPATIENT_CLINIC_OR_DEPARTMENT_OTHER): Payer: Self-pay

## 2024-09-24 ENCOUNTER — Other Ambulatory Visit: Payer: Self-pay

## 2024-09-24 ENCOUNTER — Inpatient Hospital Stay (HOSPITAL_BASED_OUTPATIENT_CLINIC_OR_DEPARTMENT_OTHER)
Admission: EM | Admit: 2024-09-24 | Discharge: 2024-09-26 | DRG: 328 | Disposition: A | Discharge status: Home / Self Care | Attending: Surgery | Admitting: Surgery

## 2024-09-24 DIAGNOSIS — Z880 Allergy status to penicillin: Secondary | ICD-10-CM

## 2024-09-24 DIAGNOSIS — K449 Diaphragmatic hernia without obstruction or gangrene: Secondary | ICD-10-CM | POA: Diagnosis not present

## 2024-09-24 DIAGNOSIS — Z833 Family history of diabetes mellitus: Secondary | ICD-10-CM | POA: Diagnosis not present

## 2024-09-24 DIAGNOSIS — R000 Tachycardia, unspecified: Secondary | ICD-10-CM | POA: Diagnosis not present

## 2024-09-24 DIAGNOSIS — R109 Unspecified abdominal pain: Secondary | ICD-10-CM | POA: Diagnosis not present

## 2024-09-24 DIAGNOSIS — Z8249 Family history of ischemic heart disease and other diseases of the circulatory system: Secondary | ICD-10-CM

## 2024-09-24 DIAGNOSIS — R140 Abdominal distension (gaseous): Secondary | ICD-10-CM | POA: Diagnosis not present

## 2024-09-24 DIAGNOSIS — K440 Diaphragmatic hernia with obstruction, without gangrene: Secondary | ICD-10-CM | POA: Diagnosis not present

## 2024-09-24 LAB — CBC
HCT: 45.7 % (ref 36.0–46.0)
HCT: 46.9 % — ABNORMAL HIGH (ref 36.0–46.0)
Hemoglobin: 15.3 g/dL — ABNORMAL HIGH (ref 12.0–15.0)
Hemoglobin: 15.1 g/dL — ABNORMAL HIGH (ref 12.0–15.0)
MCH: 29.8 pg (ref 26.0–34.0)
MCH: 29 pg (ref 26.0–34.0)
MCHC: 33.5 g/dL (ref 30.0–36.0)
MCHC: 32.2 g/dL (ref 30.0–36.0)
MCV: 88.9 fL (ref 80.0–100.0)
MCV: 90 fL (ref 80.0–100.0)
Platelets: 375 K/uL (ref 150–400)
Platelets: 356 K/uL (ref 150–400)
RBC: 5.14 MIL/uL — ABNORMAL HIGH (ref 3.87–5.11)
RBC: 5.21 MIL/uL — ABNORMAL HIGH (ref 3.87–5.11)
RDW: 13.2 % (ref 11.5–15.5)
RDW: 13.3 % (ref 11.5–15.5)
WBC: 11.6 K/uL — ABNORMAL HIGH (ref 4.0–10.5)
WBC: 10.3 K/uL (ref 4.0–10.5)
nRBC: 0 % (ref 0.0–0.2)
nRBC: 0 % (ref 0.0–0.2)

## 2024-09-24 LAB — PREGNANCY, URINE: Preg Test, Ur: NEGATIVE

## 2024-09-24 LAB — COMPREHENSIVE METABOLIC PANEL WITH GFR
ALT: 16 U/L (ref 0–44)
AST: 22 U/L (ref 15–41)
Albumin: 4.7 g/dL (ref 3.5–5.0)
Alkaline Phosphatase: 23 U/L — ABNORMAL LOW (ref 38–126)
Anion gap: 12 (ref 5–15)
BUN: 9 mg/dL (ref 6–20)
CO2: 23 mmol/L (ref 22–32)
Calcium: 10 mg/dL (ref 8.9–10.3)
Chloride: 101 mmol/L (ref 98–111)
Creatinine, Ser: 0.81 mg/dL (ref 0.44–1.00)
GFR, Estimated: >60 mL/min (ref >60)
Glucose, Bld: 157 mg/dL — ABNORMAL HIGH (ref 70–99)
Potassium: 3.9 mmol/L (ref 3.5–5.1)
Sodium: 136 mmol/L (ref 135–145)
Total Bilirubin: 0.5 mg/dL (ref 0.0–1.2)
Total Protein: 7.5 g/dL (ref 6.5–8.1)

## 2024-09-24 LAB — URINALYSIS, ROUTINE W REFLEX MICROSCOPIC
Bilirubin Urine: NEGATIVE
Glucose, UA: NEGATIVE mg/dL
Nitrite: NEGATIVE
Specific Gravity, Urine: 1.029 (ref 1.005–1.030)
pH: 6.5 (ref 5.0–8.0)

## 2024-09-24 LAB — CREATININE, SERUM
Creatinine, Ser: 0.7 mg/dL (ref 0.44–1.00)
GFR, Estimated: >60 mL/min (ref >60)

## 2024-09-24 LAB — SURGICAL PCR SCREEN
MRSA, PCR: NEGATIVE
Staphylococcus aureus: NEGATIVE

## 2024-09-24 LAB — HIV ANTIBODY (ROUTINE TESTING W REFLEX): HIV Screen 4th Generation wRfx: NONREACTIVE

## 2024-09-24 LAB — LIPASE, BLOOD: Lipase: 35 U/L (ref 11–51)

## 2024-09-24 MED ORDER — DIPHENHYDRAMINE HCL 50 MG/ML IJ SOLN: 12.5000 mg | Freq: Four times a day (QID) | INTRAMUSCULAR | Status: DC | PRN

## 2024-09-24 MED ORDER — ACETAMINOPHEN 325 MG PO TABS: 650.0000 mg | ORAL_TABLET | Freq: Four times a day (QID) | ORAL | Status: DC | PRN

## 2024-09-24 MED ORDER — METHOCARBAMOL 1000 MG/10ML IJ SOLN: 500.0000 mg | Freq: Three times a day (TID) | INTRAMUSCULAR | Status: DC | PRN

## 2024-09-24 MED ORDER — DOCUSATE SODIUM 100 MG PO CAPS
100.0000 mg | ORAL_CAPSULE | Freq: Two times a day (BID) | ORAL | Status: DC
  Administered 2024-09-24 – 2024-09-26 (×5): 100 mg via ORAL
  Filled 2024-09-24 (×5): qty 1

## 2024-09-24 MED ORDER — ONDANSETRON 4 MG PO TBDP: 4.0000 mg | ORAL_TABLET | Freq: Four times a day (QID) | ORAL | Status: DC | PRN

## 2024-09-24 MED ORDER — IOHEXOL 300 MG/ML  SOLN
100.0000 mL | Freq: Once | INTRAMUSCULAR | Status: AC | PRN
  Administered 2024-09-24: 100 mL via INTRAVENOUS

## 2024-09-24 MED ORDER — SODIUM CHLORIDE 0.9% FLUSH: 9.0000 mL | INTRAVENOUS | Status: DC | PRN

## 2024-09-24 MED ORDER — LACTATED RINGERS IV SOLN
INTRAVENOUS | Status: AC
Start: 2024-09-25 — End: 2024-09-25

## 2024-09-24 MED ORDER — METOPROLOL TARTRATE 5 MG/5ML IV SOLN: 5.0000 mg | Freq: Four times a day (QID) | INTRAVENOUS | Status: DC | PRN

## 2024-09-24 MED ORDER — HYDROMORPHONE HCL 1 MG/ML IJ SOLN
0.5000 mg | INTRAMUSCULAR | Status: DC | PRN
  Administered 2024-09-24: 0.5 mg via INTRAVENOUS
  Filled 2024-09-24: qty 0.5

## 2024-09-24 MED ORDER — DIPHENHYDRAMINE HCL 12.5 MG/5ML PO ELIX: 12.5000 mg | ORAL_SOLUTION | Freq: Four times a day (QID) | ORAL | Status: DC | PRN

## 2024-09-24 MED ORDER — NALOXONE HCL 0.4 MG/ML IJ SOLN: 0.4000 mg | INTRAMUSCULAR | Status: DC | PRN

## 2024-09-24 MED ORDER — ACETAMINOPHEN 650 MG RE SUPP: 650.0000 mg | Freq: Four times a day (QID) | RECTAL | Status: DC | PRN

## 2024-09-24 MED ORDER — ONDANSETRON HCL 4 MG/2ML IJ SOLN: 4.0000 mg | Freq: Four times a day (QID) | INTRAMUSCULAR | Status: DC | PRN

## 2024-09-24 MED ORDER — ENOXAPARIN SODIUM 40 MG/0.4ML IJ SOSY
40.0000 mg | PREFILLED_SYRINGE | INTRAMUSCULAR | Status: DC
  Administered 2024-09-24 – 2024-09-25 (×2): 40 mg via SUBCUTANEOUS
  Filled 2024-09-24 (×2): qty 0.4

## 2024-09-24 MED ORDER — OXYCODONE HCL 5 MG PO TABS: 5.0000 mg | ORAL_TABLET | ORAL | Status: DC | PRN

## 2024-09-24 MED ORDER — MORPHINE SULFATE (PF) 2 MG/ML IV SOLN
2.0000 mg | Freq: Once | INTRAVENOUS | Status: AC
  Administered 2024-09-24: 2 mg via INTRAVENOUS
  Filled 2024-09-24: qty 1

## 2024-09-24 MED ORDER — SIMETHICONE 80 MG PO CHEW: 40.0000 mg | CHEWABLE_TABLET | Freq: Four times a day (QID) | ORAL | Status: DC | PRN

## 2024-09-24 MED ORDER — HYDROMORPHONE 1 MG/ML IV SOLN: INTRAVENOUS | Status: DC

## 2024-09-24 MED ORDER — ONDANSETRON HCL 4 MG/2ML IJ SOLN
4.0000 mg | Freq: Once | INTRAMUSCULAR | Status: AC
  Administered 2024-09-24: 4 mg via INTRAVENOUS
  Filled 2024-09-24: qty 2

## 2024-09-24 MED ORDER — MORPHINE SULFATE (PF) 4 MG/ML IV SOLN
4.0000 mg | Freq: Once | INTRAVENOUS | Status: AC
  Administered 2024-09-24: 4 mg via INTRAVENOUS
  Filled 2024-09-24: qty 1

## 2024-09-24 MED ORDER — SODIUM CHLORIDE 0.9 % IV BOLUS
1000.0000 mL | Freq: Once | INTRAVENOUS | Status: AC
  Administered 2024-09-24: 1000 mL via INTRAVENOUS

## 2024-09-24 MED ORDER — METHOCARBAMOL 500 MG PO TABS: 500.0000 mg | ORAL_TABLET | Freq: Three times a day (TID) | ORAL | Status: DC | PRN

## 2024-09-24 MED ORDER — SENNOSIDES-DOCUSATE SODIUM 8.6-50 MG PO TABS
1.0000 | ORAL_TABLET | Freq: Every day | ORAL | Status: DC
  Administered 2024-09-24 – 2024-09-26 (×3): 1 via ORAL
  Filled 2024-09-24 (×3): qty 1

## 2024-09-24 MED ORDER — DIPHENHYDRAMINE HCL 50 MG/ML IJ SOLN: 25.0000 mg | Freq: Four times a day (QID) | INTRAMUSCULAR | Status: DC | PRN

## 2024-09-24 MED ORDER — DIPHENHYDRAMINE HCL 25 MG PO CAPS: 25.0000 mg | ORAL_CAPSULE | Freq: Four times a day (QID) | ORAL | Status: DC | PRN

## 2024-09-24 NOTE — ED Notes (Signed)
 Patient transported to CT

## 2024-09-24 NOTE — ED Notes (Signed)
 Tried to call report x1. Will try again

## 2024-09-24 NOTE — ED Provider Notes (Signed)
 [facility] Provider Note   CSN: [id number] Arrival date & time: 09/24/24  9877     Patient presents with: No chief complaint on file.   Tranae G Fuerst is a 48 y.o. female.   Patient is a 48 year old female with no significant past medical history.  Patient presenting today with complaints of abdominal pain.  Symptoms started at approximately 6 PM in the absence of any injury or trauma.  The pain has been constant and located at her umbilical region, but radiates to her right shoulder.  She denies any nausea or vomiting.  No fevers or chills.  No urinary complaints.  No aggravating or alleviating factors.       Prior to Admission medications   Medication Sig Start Date End Date Taking? Authorizing Provider  ibuprofen  (ADVIL ,MOTRIN ) 200 MG tablet Take 400 mg by mouth every 6 (six) hours as needed for mild pain.    [provider]  Prenatal Vit-Fe Fumarate-FA (PRENATAL MULTIVITAMIN) TABS tablet Take 1 tablet by mouth daily at 12 noon.     [provider]    Allergies: Penicillins    Review of Systems  All other systems reviewed and are negative.   Updated Vital Signs BP (!) 165/95 (BP Location: Left Arm)   Pulse (!) 141   Temp 98.4 F (36.9 C)   Resp 16   Wt 79.4 kg   SpO2 100%   BMI 27.41 kg/m   Physical Exam Vitals and nursing note reviewed.  Constitutional:      General: She is not in acute distress.    Appearance: She is well-developed. She is not diaphoretic.  HENT:     Head: Normocephalic and atraumatic.  Cardiovascular:     Rate and Rhythm: Normal rate and regular rhythm.     Heart sounds: No murmur heard.    No friction rub. No gallop.  Pulmonary:     Effort: Pulmonary effort is normal. No respiratory distress.     Breath sounds: Normal breath sounds. No wheezing.  Abdominal:     General: Bowel sounds are normal. There is no distension.     Palpations: Abdomen is soft.     Tenderness:  There is abdominal tenderness. There is no guarding or rebound.     Comments: There is tenderness to palpation in the periumbilical region.  Musculoskeletal:        General: Normal range of motion.     Cervical back: Normal range of motion and neck supple.  Skin:    General: Skin is warm and dry.  Neurological:     General: No focal deficit present.     Mental Status: She is alert and oriented to person, place, and time.     (all labs ordered are listed, but only abnormal results are displayed) Labs Reviewed  COMPREHENSIVE METABOLIC PANEL WITH GFR - Abnormal; Notable for the following components:      Result Value   Glucose, Bld 157 (*)    Alkaline Phosphatase 23 (*)    All other components within normal limits  CBC - Abnormal; Notable for the following components:   WBC 11.6 (*)    RBC 5.14 (*)    Hemoglobin 15.3 (*)    All other components within normal limits  URINALYSIS, ROUTINE W REFLEX MICROSCOPIC - Abnormal; Notable for the following components:   APPearance HAZY (*)    Hgb urine dipstick MODERATE (*)    Ketones, ur TRACE (*)    Protein,  ur TRACE (*)    Leukocytes,Ua MODERATE (*)    Bacteria, UA MANY (*)    All other components within normal limits  LIPASE, BLOOD  PREGNANCY, URINE    EKG: EKG Interpretation Date/Time:  Thursday September 24 2024 01:38:39 EDT Ventricular Rate:  135 PR Interval:  125 QRS Duration:  87 QT Interval:  301 QTC Calculation: 452 R Axis:   221  Text Interpretation: Sinus tachycardia Consider right atrial enlargement Markedly posterior QRS axis Confirmed by Geroldine Berg ([id number]) on 09/24/2024 1:51:58 AM  Radiology: No results found.   Procedures   Medications Ordered in the ED  sodium chloride 0.9 % bolus 1,000 mL (has no administration in time range)                                    Medical Decision Making Amount and/or Complexity of Data Reviewed Labs: ordered. Radiology: ordered.  Risk Prescription drug  management.   Patient is a 48 year old female presenting with complaints of abdominal and shoulder pain as described in the HPI.  She arrives here with stable vital signs and is afebrile.  She has periumbilical tenderness, but no peritoneal signs.  Laboratory studies obtained including CBC, comprehensive metabolic panel, and lipase.  White count is 11.6, but laboratory studies otherwise unremarkable.  Urinalysis is suggestive of UTI.  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  The defect measures approximately 2.7 x 3.5 cm with gaseous distention of the herniated ascending colon.  Patient has received morphine for pain and Zofran  for nausea.  I will also treat with Rocephin for UTI.  I have discussed the CT findings with Dr. Lyndel from general surgery.  He has recommended admission for further observation and determination of treatment plan.     Final diagnoses:  None    ED Discharge Orders     None          Geroldine Berg, MD 09/24/24 [phone]

## 2024-09-24 NOTE — ED Notes (Signed)
[facility] here to transport pt 

## 2024-09-24 NOTE — ED Triage Notes (Signed)
 1500 today abd pain worsening to right shoulder pain. Last BM today x2. +N/-V/-D.

## 2024-09-24 NOTE — Plan of Care (Signed)
   Problem: Education: Goal: Knowledge of General Education information will improve Description Including pain rating scale, medication(s)/side effects and non-pharmacologic comfort measures Outcome: Progressing

## 2024-09-24 NOTE — ED Notes (Signed)
 Spoke with diplomatic services operational officer on the floor the pt is going and asked her to have the nurse call when she is ready for report.

## 2024-09-24 NOTE — ED Notes (Signed)
 Thomas with [facility] called for transport

## 2024-09-24 NOTE — H&P (Signed)
 Destiny Stewart [birth year]  [id number].    Chief Complaint/Reason for Consult: diaphragmatic hernia  HPI:  This is a 48 yo female with no significant past medical history who presented to the [facility] ED secondary to periumbilical abdominal pain that radiates to her right shoulder.  This started around 1500 yesterday.  She denies any trauma to her abdomen of chest.  She has had more mild episodes of similar pain that resolved quickly and she attributed these to just gas related pain.  She has not had any vomiting, or diarrhea. She is just a little nauseated now. Her last BM was yesterday afternoon.  She denies fevers, chills, CP, SOB, dysuria etc. No prior abdominal surgery.  In the ED she has been found to have slightly elevated WBC of 11.6, UA c/w UTI, and a CT scan showing a R diaphragmatic hernia with ascending colon and hepatic flexure in the right pleural space.  No obstruction noted.  We have been asked to see for further recommendations.  ROS: ROS: see HPI  Family History  Problem Relation Age of Onset   Diabetes Father    Depression Father    Depression Brother    Diabetes Maternal Grandmother    Heart disease Paternal Grandfather     Past Medical History:  Diagnosis Date   Complication of anesthesia    Postpartum care following vaginal delivery (12/23) 11/17/2014   Seasonal allergies    SVD (spontaneous vaginal delivery)    x 1    Past Surgical History:  Procedure Laterality Date   DILATION AND EVACUATION N/A 03/01/2017   Procedure: DILATATION AND EVACUATION;  Surgeon: Dickie Carder, MD;  Location: [facility];  Service: Gynecology;  Laterality: N/A;   OPERATIVE ULTRASOUND N/A 03/01/2017   Procedure: OPERATIVE ULTRASOUND;  Surgeon: Dickie Carder, MD;  Location: [facility];  Service: Gynecology;  Laterality: N/A;   TONSILLECTOMY     WISDOM TOOTH EXTRACTION      Social History:  reports that she has never smoked. She has never used smokeless tobacco. She reports  current alcohol use. She reports that she does not use drugs.  Allergies:  Allergies  Allergen Reactions   Penicillins Rash    As a child Has patient had a PCN reaction causing immediate rash, facial/tongue/throat swelling, SOB or lightheadedness with hypotension: Unknown Has patient had a PCN reaction causing severe rash involving mucus membranes or skin necrosis: Uknown Has patient had a PCN reaction that required hospitalization: Unknown Has patient had a PCN reaction occurring within the last 10 years: No If all of the above answers are NO, then may proceed with Cephalosporin use.     Medications Prior to Admission  Medication Sig Dispense Refill   ibuprofen  (ADVIL ,MOTRIN ) 200 MG tablet Take 400 mg by mouth every 6 (six) hours as needed for mild pain.     Multiple Vitamins-Minerals (MULTIVITAMIN WITH MINERALS) tablet Take 1 tablet by mouth daily.     norethindrone (AYGESTIN) 5 MG tablet Take 5 mg by mouth daily.     Prenatal Vit-Fe Fumarate-FA (PRENATAL MULTIVITAMIN) TABS tablet Take 1 tablet by mouth daily at 12 noon.  (Patient not taking: Reported on 09/24/2024)       Physical Exam: Blood pressure (!) 154/82, pulse 83, temperature 98.9 F (37.2 C), temperature source Oral, resp. rate 18, weight 79.4 kg, SpO2 100%, unknown if currently breastfeeding.  General: pleasant, WD, WN female who is laying in bed in NAD HEENT: head is normocephalic, atraumatic.  Sclera are noninjected.  PERRL.  Ears and nose without any masses or lesions.  Mouth is pink and moist Heart: regular, rate, and rhythm.  Palpable radial and pedal pulses bilaterally Lungs: Respiratory effort nonlabored Abd: soft, NT, ND, no masses, hernias, or organomegaly MS: all 4 extremities are symmetrical with no cyanosis, clubbing, or edema. Skin: warm and dry with no masses, lesions, or rashes Neuro: Cranial nerves 2-12 grossly intact, sensation is normal throughout Psych: A&Ox3 with an appropriate affect.  Results  for orders placed or performed during the hospital encounter of 09/24/24 (from the past 48 hours)  Lipase, blood     Status: None   Collection Time: 09/24/24  1:44 AM  Result Value Ref Range   Lipase 35 11 - 51 U/L    Comment: Performed at [facility], [address] [place], [place] [address]  Comprehensive metabolic panel     Status: Abnormal   Collection Time: 09/24/24  1:44 AM  Result Value Ref Range   Sodium 136 135 - 145 mmol/L   Potassium 3.9 3.5 - 5.1 mmol/L   Chloride 101 98 - 111 mmol/L   CO2 23 22 - 32 mmol/L   Glucose, Bld 157 (H) 70 - 99 mg/dL    Comment: Glucose reference range applies only to samples taken after fasting for at least 8 hours.   BUN 9 6 - 20 mg/dL   Creatinine, Ser 9.18 0.44 - 1.00 mg/dL   Calcium 89.9 8.9 - 89.6 mg/dL   Total Protein 7.5 6.5 - 8.1 g/dL   Albumin 4.7 3.5 - 5.0 g/dL   AST 22 15 - 41 U/L   ALT 16 0 - 44 U/L   Alkaline Phosphatase 23 (L) 38 - 126 U/L   Total Bilirubin 0.5 0.0 - 1.2 mg/dL   GFR, Estimated >39 >39 mL/min    Comment: (NOTE) Calculated using the CKD-EPI Creatinine Equation (2021)    Anion gap 12 5 - 15    Comment: Performed at [facility], [address] [place], [place] [address]  CBC     Status: Abnormal   Collection Time: 09/24/24  1:44 AM  Result Value Ref Range   WBC 11.6 (H) 4.0 - 10.5 K/uL   RBC 5.14 (H) 3.87 - 5.11 MIL/uL   Hemoglobin 15.3 (H) 12.0 - 15.0 g/dL   HCT 54.2 63.9 - 53.9 %   MCV 88.9 80.0 - 100.0 fL   MCH 29.8 26.0 - 34.0 pg   MCHC 33.5 30.0 - 36.0 g/dL   RDW 86.7 88.4 - 84.4 %   Platelets 375 150 - 400 K/uL   nRBC 0.0 0.0 - 0.2 %    Comment: Performed at [facility], [address], [place], [place] [address]  Pregnancy, urine     Status: None   Collection Time: 09/24/24  1:49 AM  Result Value Ref Range   Preg Test, Ur NEGATIVE NEGATIVE    Comment:        THE SENSITIVITY OF THIS METHODOLOGY IS >20 mIU/mL. Performed at [facility], [address] [place], [place] [address]   Urinalysis, Routine w reflex microscopic -Urine, Clean Catch     Status: Abnormal   Collection Time: 09/24/24  1:50 AM  Result Value Ref Range   Color, Urine YELLOW YELLOW   APPearance HAZY (A) CLEAR   Specific Gravity, Urine 1.029 1.005 - 1.030   pH 6.5 5.0 - 8.0   Glucose, UA NEGATIVE NEGATIVE mg/dL   Hgb urine dipstick MODERATE (  A) NEGATIVE   Bilirubin Urine NEGATIVE NEGATIVE   Ketones, ur TRACE (A) NEGATIVE mg/dL   Protein, ur TRACE (A) NEGATIVE mg/dL   Nitrite NEGATIVE NEGATIVE   Leukocytes,Ua MODERATE (A) NEGATIVE   RBC / HPF 21-50 0 - 5 RBC/hpf   WBC, UA 0-5 0 - 5 WBC/hpf   Bacteria, UA MANY (A) NONE SEEN   Squamous Epithelial / HPF 11-20 0 - 5 /HPF   Mucus PRESENT     Comment: Performed at [facility], [address], [place], [place] [address]   CT ABDOMEN PELVIS W CONTRAST Result Date: 09/24/2024 EXAM: CT ABDOMEN AND PELVIS WITH CONTRAST 09/24/2024 02:44:14 AM TECHNIQUE: CT of the abdomen and pelvis was performed with the administration of 100 mL of iohexol (OMNIPAQUE) 300 MG/ML solution. Multiplanar reformatted images are provided for review. Automated exposure control, iterative reconstruction, and/or weight-based adjustment of the mA/kV was utilized to reduce the radiation dose to as low as reasonably achievable. COMPARISON: None available. CLINICAL HISTORY: Abdominal pain, acute, nonlocalized. 1500 today abd pain worsening to right shoulder pain. Last BM today x2. +N/-V/-D. FINDINGS: LOWER CHEST: Right diaphragmatic hernia is present with herniation of the ascending colon and hepatic flexure into the right pleural space. The hernia defect measures roughly 2.7 x 3.5 cm and there is gaseous distention of the herniated ascending colon within the right hemithorax. There is no evidence of obstruction, however, there is significant traction upon the mesentery. LIVER: The liver is unremarkable.  GALLBLADDER AND BILE DUCTS: Gallbladder is unremarkable. No biliary ductal dilatation. SPLEEN: No acute abnormality. PANCREAS: No acute abnormality. ADRENAL GLANDS: No acute abnormality. KIDNEYS, URETERS AND BLADDER: No stones in the kidneys or ureters. No hydronephrosis. No perinephric or periureteral stranding. Urinary bladder is unremarkable. GI AND BOWEL: The stomach, small bowel, and large bowel are otherwise unremarkable. PERITONEUM AND RETROPERITONEUM: No ascites. No free air. VASCULATURE: Aorta is normal in caliber. LYMPH NODES: No lymphadenopathy. REPRODUCTIVE ORGANS: No acute abnormality. BONES AND SOFT TISSUES: No acute osseous abnormality. No focal soft tissue abnormality. IMPRESSION: 1. Right diaphragmatic hernia with herniation of the ascending colon and hepatic flexure into the right pleural space. Hernia defect measuring roughly 2.7 x 3.5 cm, with gaseous distention of the herniated ascending colon. No obstruction. Electronically signed by: Dorethia Molt MD 09/24/2024 03:22 AM EDT RP Workstation: [id number]      Assessment/Plan Right diaphragmatic hernia with colon, no obstruction The patient has been seen, examined, labs, vitals, chart, and imaging personally reviewed.  Unclear why the patient has this type of hernia given no recent trauma etc.  Nonetheless, she is currently not obstructed, but will need intervention to correct this problem.  Will need to discuss timing and plans for surgery with OR and MD   FEN - HH diet, NPO after MN VTE - [facility] ID - LR @100  cc/h starting at [facility] Admit - inpatient with plans for surgical repair   I reviewed ED provider notes, last 24 h vitals and pain scores, last 48 h intake and output, last 24 h labs and trends, and last 24 h imaging results.  Destiny Stewart, [facility] 09/24/2024, 2:12 PM Please see Amion for pager number during day hours 7:00am-4:30pm or 7:00am -11:30am on weekends

## 2024-09-24 NOTE — ED Notes (Signed)
 [facility] called for transport

## 2024-09-25 ENCOUNTER — Inpatient Hospital Stay (HOSPITAL_COMMUNITY): Admitting: Anesthesiology

## 2024-09-25 ENCOUNTER — Encounter (HOSPITAL_COMMUNITY): Payer: Self-pay

## 2024-09-25 ENCOUNTER — Encounter (HOSPITAL_COMMUNITY): Admission: EM | Disposition: A | Discharge status: Home / Self Care | Payer: Self-pay | Source: Home / Self Care

## 2024-09-25 HISTORY — PX: XI ROBOTIC ASSISTED HIATAL HERNIA REPAIR: SHX6889

## 2024-09-25 LAB — CBC
HCT: 44.6 % (ref 36.0–46.0)
Hemoglobin: 14.5 g/dL (ref 12.0–15.0)
MCH: 28.8 pg (ref 26.0–34.0)
MCHC: 32.5 g/dL (ref 30.0–36.0)
MCV: 88.5 fL (ref 80.0–100.0)
Platelets: 373 K/uL (ref 150–400)
RBC: 5.04 MIL/uL (ref 3.87–5.11)
RDW: 13.2 % (ref 11.5–15.5)
WBC: 8.6 K/uL (ref 4.0–10.5)
nRBC: 0 % (ref 0.0–0.2)

## 2024-09-25 LAB — BASIC METABOLIC PANEL WITH GFR
Anion gap: 13 (ref 5–15)
BUN: 9 mg/dL (ref 6–20)
CO2: 23 mmol/L (ref 22–32)
Calcium: 9 mg/dL (ref 8.9–10.3)
Chloride: 103 mmol/L (ref 98–111)
Creatinine, Ser: 0.59 mg/dL (ref 0.44–1.00)
GFR, Estimated: >60 mL/min (ref >60)
Glucose, Bld: 113 mg/dL — ABNORMAL HIGH (ref 70–99)
Potassium: 3.8 mmol/L (ref 3.5–5.1)
Sodium: 139 mmol/L (ref 135–145)

## 2024-09-25 SURGERY — REPAIR, HERNIA, HIATAL, ROBOT-ASSISTED
Anesthesia: General | Site: Abdomen

## 2024-09-25 MED ORDER — FENTANYL CITRATE (PF) 50 MCG/ML IJ SOSY
PREFILLED_SYRINGE | INTRAMUSCULAR | Status: AC
  Filled 2024-09-25: qty 1

## 2024-09-25 MED ORDER — DROPERIDOL 2.5 MG/ML IJ SOLN
0.6250 mg | Freq: Once | INTRAMUSCULAR | Status: DC | PRN
Start: 2024-09-25 — End: 2024-09-25

## 2024-09-25 MED ORDER — IBUPROFEN 600 MG PO TABS: 600.0000 mg | ORAL_TABLET | Freq: Four times a day (QID) | ORAL | 1 refills | Status: AC

## 2024-09-25 MED ORDER — LIDOCAINE HCL (PF) 2 % IJ SOLN
INTRAMUSCULAR | Status: AC
  Filled 2024-09-25: qty 5

## 2024-09-25 MED ORDER — BUPIVACAINE-EPINEPHRINE (PF) 0.25% -1:200000 IJ SOLN
INTRAMUSCULAR | Status: AC
  Filled 2024-09-25: qty 60

## 2024-09-25 MED ORDER — METHOCARBAMOL 1000 MG/10ML IJ SOLN
1000.0000 mg | Freq: Three times a day (TID) | INTRAMUSCULAR | Status: DC
  Administered 2024-09-25 (×2): 1000 mg via INTRAVENOUS
  Filled 2024-09-25 (×3): qty 10

## 2024-09-25 MED ORDER — POLYETHYLENE GLYCOL 3350 17 G PO PACK
17.0000 g | PACK | Freq: Every day | ORAL | 1 refills | Status: AC
Start: 2024-09-25 — End: ?

## 2024-09-25 MED ORDER — 0.9 % SODIUM CHLORIDE (POUR BTL) OPTIME
TOPICAL | Status: DC | PRN
  Administered 2024-09-25: 1000 mL

## 2024-09-25 MED ORDER — ROCURONIUM BROMIDE 100 MG/10ML IV SOLN
INTRAVENOUS | Status: DC | PRN
  Administered 2024-09-25: 20 mg via INTRAVENOUS
  Administered 2024-09-25: 60 mg via INTRAVENOUS

## 2024-09-25 MED ORDER — OXYCODONE HCL 5 MG PO TABS: 5.0000 mg | ORAL_TABLET | ORAL | 0 refills | Status: AC | PRN

## 2024-09-25 MED ORDER — MIDAZOLAM HCL 5 MG/5ML IJ SOLN
INTRAMUSCULAR | Status: DC | PRN
  Administered 2024-09-25 (×2): 1 mg via INTRAVENOUS

## 2024-09-25 MED ORDER — BUPIVACAINE-EPINEPHRINE 0.25% -1:200000 IJ SOLN
INTRAMUSCULAR | Status: DC | PRN
  Administered 2024-09-25: 30 mL

## 2024-09-25 MED ORDER — LACTATED RINGERS IV SOLN: INTRAVENOUS | Status: DC

## 2024-09-25 MED ORDER — ROCURONIUM BROMIDE 10 MG/ML (PF) SYRINGE
PREFILLED_SYRINGE | INTRAVENOUS | Status: AC
  Filled 2024-09-25: qty 10

## 2024-09-25 MED ORDER — SUGAMMADEX SODIUM 200 MG/2ML IV SOLN
INTRAVENOUS | Status: AC
  Filled 2024-09-25: qty 2

## 2024-09-25 MED ORDER — PROPOFOL 500 MG/50ML IV EMUL
INTRAVENOUS | Status: AC
  Filled 2024-09-25: qty 50

## 2024-09-25 MED ORDER — ONDANSETRON HCL 4 MG/2ML IJ SOLN
INTRAMUSCULAR | Status: AC
  Filled 2024-09-25: qty 2

## 2024-09-25 MED ORDER — PROPOFOL 10 MG/ML IV BOLUS
INTRAVENOUS | Status: DC | PRN
Start: 2024-09-25 — End: 2024-09-25
  Administered 2024-09-25: 150 mg via INTRAVENOUS
  Administered 2024-09-25: 50 mg via INTRAVENOUS

## 2024-09-25 MED ORDER — LIDOCAINE HCL (CARDIAC) PF 100 MG/5ML IV SOSY
PREFILLED_SYRINGE | INTRAVENOUS | Status: DC | PRN
  Administered 2024-09-25: 60 mg via INTRAVENOUS

## 2024-09-25 MED ORDER — FENTANYL CITRATE (PF) 250 MCG/5ML IJ SOLN
INTRAMUSCULAR | Status: AC
  Filled 2024-09-25: qty 5

## 2024-09-25 MED ORDER — PHENYLEPHRINE HCL (PRESSORS) 10 MG/ML IV SOLN
INTRAVENOUS | Status: DC | PRN
  Administered 2024-09-25: 100 ug via INTRAVENOUS

## 2024-09-25 MED ORDER — CHLORHEXIDINE GLUCONATE 0.12 % MT SOLN
15.0000 mL | Freq: Once | OROMUCOSAL | Status: AC
  Administered 2024-09-25: 15 mL via OROMUCOSAL
  Filled 2024-09-25: qty 15

## 2024-09-25 MED ORDER — DEXMEDETOMIDINE HCL IN NACL 80 MCG/20ML IV SOLN
INTRAVENOUS | Status: DC | PRN
  Administered 2024-09-25 (×2): 8 ug via INTRAVENOUS

## 2024-09-25 MED ORDER — ONDANSETRON HCL 4 MG/2ML IJ SOLN
INTRAMUSCULAR | Status: DC | PRN
  Administered 2024-09-25: 4 mg via INTRAVENOUS

## 2024-09-25 MED ORDER — ACETAMINOPHEN 500 MG PO TABS: 1000.0000 mg | ORAL_TABLET | Freq: Four times a day (QID) | ORAL | 3 refills | Status: AC

## 2024-09-25 MED ORDER — ORAL CARE MOUTH RINSE: 15.0000 mL | Freq: Once | OROMUCOSAL | Status: AC

## 2024-09-25 MED ORDER — OXYCODONE HCL 5 MG PO TABS: 5.0000 mg | ORAL_TABLET | Freq: Once | ORAL | Status: DC | PRN

## 2024-09-25 MED ORDER — METHOCARBAMOL 750 MG PO TABS
750.0000 mg | ORAL_TABLET | Freq: Four times a day (QID) | ORAL | 1 refills | Status: AC
Start: 2024-09-25 — End: ?

## 2024-09-25 MED ORDER — ALBUMIN HUMAN 5 % IV SOLN
INTRAVENOUS | Status: AC
Start: 2024-09-25 — End: 2024-09-25
  Filled 2024-09-25: qty 250

## 2024-09-25 MED ORDER — FENTANYL CITRATE (PF) 50 MCG/ML IJ SOSY
25.0000 ug | PREFILLED_SYRINGE | INTRAMUSCULAR | Status: DC | PRN
  Administered 2024-09-25 (×2): 50 ug via INTRAVENOUS

## 2024-09-25 MED ORDER — PROPOFOL 500 MG/50ML IV EMUL
INTRAVENOUS | Status: DC | PRN
  Administered 2024-09-25: 20 ug/kg/min via INTRAVENOUS

## 2024-09-25 MED ORDER — CEFAZOLIN SODIUM-DEXTROSE 2-4 GM/100ML-% IV SOLN
2.0000 g | Freq: Once | INTRAVENOUS | Status: AC
  Administered 2024-09-25: 2 g via INTRAVENOUS
  Filled 2024-09-25: qty 100

## 2024-09-25 MED ORDER — DEXAMETHASONE SOD PHOSPHATE PF 10 MG/ML IJ SOLN
INTRAMUSCULAR | Status: DC | PRN
  Administered 2024-09-25: 8 mg via INTRAVENOUS

## 2024-09-25 MED ORDER — MIDAZOLAM HCL 2 MG/2ML IJ SOLN
INTRAMUSCULAR | Status: AC
  Filled 2024-09-25: qty 2

## 2024-09-25 MED ORDER — FENTANYL CITRATE (PF) 100 MCG/2ML IJ SOLN
INTRAMUSCULAR | Status: DC | PRN
  Administered 2024-09-25: 25 ug via INTRAVENOUS
  Administered 2024-09-25 (×3): 50 ug via INTRAVENOUS
  Administered 2024-09-25: 25 ug via INTRAVENOUS

## 2024-09-25 MED ORDER — OXYCODONE HCL 5 MG/5ML PO SOLN: 5.0000 mg | Freq: Once | ORAL | Status: DC | PRN

## 2024-09-25 MED ORDER — SUGAMMADEX SODIUM 200 MG/2ML IV SOLN
INTRAVENOUS | Status: DC | PRN
  Administered 2024-09-25: 200 mg via INTRAVENOUS

## 2024-09-25 MED ORDER — ACETAMINOPHEN 500 MG PO TABS
1000.0000 mg | ORAL_TABLET | Freq: Four times a day (QID) | ORAL | Status: DC
  Filled 2024-09-25 (×2): qty 2

## 2024-09-25 MED ORDER — ACETAMINOPHEN 10 MG/ML IV SOLN: 1000.0000 mg | Freq: Once | INTRAVENOUS | Status: DC | PRN

## 2024-09-25 MED ORDER — IBUPROFEN 400 MG PO TABS
600.0000 mg | ORAL_TABLET | Freq: Four times a day (QID) | ORAL | Status: DC
  Administered 2024-09-25 – 2024-09-26 (×3): 600 mg via ORAL
  Filled 2024-09-25 (×3): qty 1

## 2024-09-25 SURGICAL SUPPLY — 58 items
BAG COUNTER SPONGE SURGICOUNT (BAG) ×1 IMPLANT
BLADE SURG SZ11 CARB STEEL (BLADE) ×1 IMPLANT
CHLORAPREP W/TINT 26 (MISCELLANEOUS) ×1 IMPLANT
CLIP APPLIE 5 13 M/L LIGAMAX5 (MISCELLANEOUS) IMPLANT
CLIP APPLIE ROT 10 11.4 M/L (STAPLE) IMPLANT
COVER SURGICAL LIGHT HANDLE (MISCELLANEOUS) ×1 IMPLANT
COVER TIP SHEARS 8 DVNC (MISCELLANEOUS) IMPLANT
DERMABOND ADVANCED .7 DNX12 (GAUZE/BANDAGES/DRESSINGS) ×1 IMPLANT
DRAIN PENROSE 0.5X18 (DRAIN) ×1 IMPLANT
DRAPE ARM DVNC X/XI (DISPOSABLE) ×4 IMPLANT
DRAPE COLUMN DVNC XI (DISPOSABLE) ×1 IMPLANT
DRIVER NDL LRG 8 DVNC XI (INSTRUMENTS) ×1 IMPLANT
DRIVER NDL MEGA SUTCUT DVNCXI (INSTRUMENTS) ×1 IMPLANT
DRIVER NDLE LRG 8 DVNC XI (INSTRUMENTS) IMPLANT
DRIVER NDLE MEGA SUTCUT DVNCXI (INSTRUMENTS) ×1 IMPLANT
ELECT REM PT RETURN 15FT ADLT (MISCELLANEOUS) ×1 IMPLANT
ENDOLOOP SUT PDS II 0 18 (SUTURE) IMPLANT
FORCEPS CADIERE DVNC XI (FORCEP) IMPLANT
GAUZE 4X4 16PLY ~~LOC~~+RFID DBL (SPONGE) ×1 IMPLANT
GLOVE BIO SURGEON STRL SZ7.5 (GLOVE) ×3 IMPLANT
GLOVE INDICATOR 8.0 STRL GRN (GLOVE) ×3 IMPLANT
GOWN STRL REUS W/ TWL XL LVL3 (GOWN DISPOSABLE) ×2 IMPLANT
GRASPER SUT TROCAR 14GX15 (MISCELLANEOUS) ×1 IMPLANT
GRASPER TIP-UP FEN DVNC XI (INSTRUMENTS) ×1 IMPLANT
IRRIGATION SUCT STRKRFLW 2 WTP (MISCELLANEOUS) IMPLANT
KIT BASIN OR (CUSTOM PROCEDURE TRAY) ×1 IMPLANT
KIT TURNOVER KIT A (KITS) ×1 IMPLANT
LUBRICANT JELLY K Y 4OZ (MISCELLANEOUS) IMPLANT
MARKER SKIN DUAL TIP RULER LAB (MISCELLANEOUS) ×1 IMPLANT
MESH VENTRALIGHT ST 6IN CRC (Mesh General) IMPLANT
NDL HYPO 22X1.5 SAFETY MO (MISCELLANEOUS) ×1 IMPLANT
NEEDLE HYPO 22X1.5 SAFETY MO (MISCELLANEOUS) ×1 IMPLANT
PACK CARDIOVASCULAR III (CUSTOM PROCEDURE TRAY) ×1 IMPLANT
PAD POSITIONING PINK XL (MISCELLANEOUS) ×1 IMPLANT
RETRACTOR GRSP SML 8 DVNC XI (INSTRUMENTS) ×1 IMPLANT
SCISSORS LAP 5X35 DISP (ENDOMECHANICALS) IMPLANT
SCISSORS MNPLR CVD DVNC XI (INSTRUMENTS) ×1 IMPLANT
SEAL UNIV 5-12 XI (MISCELLANEOUS) ×3 IMPLANT
SEALER VESSEL EXT DVNC XI (MISCELLANEOUS) ×1 IMPLANT
SET TUBE SMOKE EVAC HIGH FLOW (TUBING) ×1 IMPLANT
SOLUTION ANTFG W/FOAM PAD STRL (MISCELLANEOUS) ×1 IMPLANT
SOLUTION ELECTROSURG ANTI STCK (MISCELLANEOUS) ×1 IMPLANT
SPIKE FLUID TRANSFER (MISCELLANEOUS) ×1 IMPLANT
SUT ETHIBOND 0 36 GRN (SUTURE) ×2 IMPLANT
SUT MNCRL AB 4-0 PS2 18 (SUTURE) ×1 IMPLANT
SUT PROLENE 0 SH 30 (SUTURE) IMPLANT
SUT SILK 0 SH 30 (SUTURE) IMPLANT
SUT SILK 2 0 SH (SUTURE) IMPLANT
SUT STRATA PDS 2-0 23 CT-1 (SUTURE) IMPLANT
SUTURE STRAFIX SYMMETRC 1-0 12 (SUTURE) IMPLANT
SYR 20ML LL LF (SYRINGE) ×1 IMPLANT
TIP INNERVISION DETACH 40FR (MISCELLANEOUS) IMPLANT
TIP INNERVISION DETACH 50FR (MISCELLANEOUS) IMPLANT
TIP INNERVISION DETACH 56FR (MISCELLANEOUS) IMPLANT
TOWEL OR 17X26 10 PK STRL BLUE (TOWEL DISPOSABLE) ×1 IMPLANT
TRAY FOLEY MTR SLVR 14FR STAT (SET/KITS/TRAYS/PACK) IMPLANT
TROCAR ADV FIXATION 5X100MM (TROCAR) IMPLANT
TROCAR BALLN 12MMX100 BLUNT (TROCAR) IMPLANT

## 2024-09-25 NOTE — Discharge Instructions (Addendum)
 [facility]  LAPAROSCOPIC SURGERY: POST OP INSTRUCTIONS Always review your discharge instruction sheet given to you by the facility where your surgery was performed. IF YOU HAVE DISABILITY OR FAMILY LEAVE FORMS, YOU MUST BRING THEM TO THE OFFICE FOR PROCESSING.   DO NOT GIVE THEM TO YOUR DOCTOR.  PAIN CONTROL  Pain regimen: take over-the-counter tylenol  (acetaminophen ) 1000mg  every six hours, the prescription ibuprofen  (600mg ) every six hours and the robaxin (methocarbamol) 750mg  every six hours. With all three of these, you should be taking something every two hours. Example: tylenol  (acetaminophen ) at 8am, ibuprofen  at 10am, robaxin (methocarbamol) at 12pm, tylenol  (acetaminophen ) again at 2pm, ibuprofen  again at 4pm, robaxin (methocarbamol) at 6pm. You also have a prescription for oxycodone , which should be taken if the tylenol  (acetaminophen ), ibuprofen , and robaxin (methocarbamol) are not enough to control your pain. You may take the oxycodone  as frequently as every four hours as needed, but if you are taking the other medications as above, you should not need the oxycodone  this frequently. You have also been given a prescription for Miralax which is a stool softener. Please take this as prescribed because the oxycodone  can cause constipation and the Miralax will minimize or prevent constipation. Do not drive while taking or under the influence of the oxycodone  as it is a narcotic medication. Use ice packs to help control pain.  If you need a refill on your pain medication, please contact your pharmacy.  They will contact our office to request authorization. Prescriptions will not be filled after 5pm or on week-ends.  HOME MEDICATIONS Take your usually prescribed medications unless otherwise directed.  DIET You should follow a light diet the several additional days after arrival home.  Be sure to include lots of fluids daily.  Do not consume alcohol while taking  oxycodone  or ibuprofen .   CONSTIPATION It is common to experience some constipation after surgery and if you are taking pain medication. Constipation will make your abdominal pain worse, so it is best to try to prevent it by increasing fluid intake and taking a stool softener. You have already been given a prescription for a mild laxative, Miralax, which you should be taking once daily. You can increase the Miralax to twice daily or even three times daily until you have a bowel movement. If still no bowel movement 24 hours after taking Miralax three times in one day, you may try magnesium  citrate, available over the counter at a local pharmacy.   WOUND/INCISION CARE Most patients will experience some swelling and bruising in the area of the incisions.  Ice packs will help.  Swelling and bruising can take several days to resolve.  May shower beginning 09/26/2024.  Do not peel off or scrub skin glue. May allow warm soapy water to run over incision, then rinse and pat dry.  Do not soak in any water (tubs, hot tubs, pools, lakes, oceans) for one week.   ACTIVITIES You may resume regular (light) daily activities beginning the next day--such as daily self-care, walking, climbing stairs--gradually increasing activities as tolerated.  You may have sexual intercourse when it is comfortable.   No lifting greater than 5 pounds for six weeks.  You may drive when you are no longer taking narcotic pain medication, you can comfortably wear a seatbelt, and you can safely maneuver your car and apply brakes.  FOLLOW-UP You should see your doctor in the office for a follow-up appointment approximately 2-3 weeks after your surgery.  You should have  been given your post-op/follow-up appointment when your surgery was scheduled.  If you did not receive a post-op/follow-up appointment, make sure that you call for this appointment within a day or two after you arrive home to ensure a convenient appointment time.  WHEN TO  CALL YOUR DOCTOR: Fever over 101.5 Inability to urinate Continued bleeding from incision. Increased pain, redness, or drainage from the incision. Increasing abdominal pain  The clinic staff is available to answer your questions during regular business hours.  Please don't hesitate to call and ask to speak to one of the nurses for clinical concerns.  If you have a medical emergency, go to the nearest emergency room or call 911.  A surgeon from [facility] is always on call at the hospital. [address], [address], [place], [place]  [address] ? [address] [address], [place], [place]   [address] [phone] ? [phone] ? FAX [phone] Web site: [email or web address]

## 2024-09-25 NOTE — Transfer of Care (Signed)
 Immediate Anesthesia Transfer of Care Note  Patient: Destiny Stewart  Procedure(s) Performed: REPAIR, HERNIA DIAPHRAGMATIC ROBOT-ASSISTED WITH ACECALPEXY (Abdomen)  Patient Location: PACU  Anesthesia Type:General  Level of Consciousness: awake, alert , oriented, and patient cooperative  Airway & Oxygen Therapy: Patient Spontanous Breathing and Patient connected to face mask oxygen  Post-op Assessment: Report given to RN and Post -op Vital signs reviewed and stable  Post vital signs: Reviewed and stable  Last Vitals:  Vitals Value Taken Time  BP 142/69 09/25/24 13:41  Temp    Pulse 88 09/25/24 13:45  Resp    SpO2 100 % 09/25/24 13:45  Vitals shown include unfiled device data.  Last Pain:  Vitals:   09/25/24 0934  TempSrc:   PainSc: 0-No pain      Patients Stated Pain Goal: 4 (09/25/24 0934)  Complications: No notable events documented.

## 2024-09-25 NOTE — Op Note (Signed)
 Patient: Destiny Stewart ([birth year], [id number])  Date of Surgery: 09/25/2024  Preoperative Diagnosis: Right diaphragmatic hernia - failure of central diaphragm tendon to close  Postoperative Diagnosis: Right diaphragmatic hernia - failure of central diaphragm tendon to close   Surgical Procedure:   Robotic repair of right diaphragmatic hernia with mesh Cecopexy  Operative Team Members:  Surgeons and Role:    * Epifanio Labrador, Deward PARAS, MD - Primary   Anesthesiologist: Erma Thom SAUNDERS, MD CRNA: Kathern Rollene LABOR, CRNA; Obadiah Reyes BROCKS, CRNA; Therisa Doyal CROME, CRNA   Anesthesia: General   Fluids:  Total I/O In: 100 [IV Piggyback:100] Out: 230 [Urine:210; Blood:20]  Complications: None  Drains:  none   Specimen: * No specimens in log *   Disposition:  PACU - hemodynamically stable.  Plan of Care: Continue inpatient care    Indications for Procedure: Destiny Stewart is a 48 y.o. female who presented with a partial large bowel obstruction with her right colon herniating above the liver through a right diaphragmatic hernia defect.  I recommended robotic right diaphragmatic hernia repair with mesh.  The procedure itself as well as its risks, benefits and alternatives were discussed.  The risks discussed included but were not limited to the risk of infection, bleeding, damage to nearby structures, and recurrent hernia, bowel injury.  After a full discussion and all questions answered the patient granted consent to proceed.  Findings: Right congenital appearing diaphragmatic hernia consistent with failure of the diaphragmatic central tendon to form containing the cecum.   Description of Procedure:   On the day stated above the patient was taken the operating room and general endotracheal anesthesia was induced.  She was placed in left side down lateral positioning.  The patient's abdomen is prepped and draped in usual sterile fashion.  A timeout was completed verifying the  correct patient, procedure, positioning, and equipment needed for the case.  I placed a Veress needle at the umbilicus and inflated the belly to 15 mmHg.  A 8 mm trocar was placed in this position and 2 additional 8 mm trocars were placed across the right abdomen.  The [employer] robotic system was docked and I inspected the right upper quadrant.  There was a diaphragmatic hernia containing the ascending colon.  It appeared to be the results of the failure of the central diaphragmatic tendon to form.  The central tendon of the diaphragm was opened further as the edges of the hernia sac were very weak tissue.  This allowed me to carefully reduce the right colon out of the right chest without injury.  The hernia defect was approximately 8 cm wide by 5 cm tall.  The diaphragmatic defect was closed using running 0 Prolene suture.  A piece of 15 cm round Bard Ventralex mesh was positioned over the defect and sutured circumferentially using 2-0 Ethibond spiral symmetric suture.  The right colon was inspected.  The mesentery had been significantly stretched and it seemed it would be easy for the right colon to volvulized postoperatively.  I decided perform a cecopexy.  3 silk sutures were placed to pexied the ascending colon to the right abdominal sidewall.  This position the appendix in the right lower quadrant and the colon appeared to have an appropriate position at the conclusion of the case.  The trocars were removed the abdomen was deflated all suture counts were correct the skin was closed using 4-0 Monocryl and Dermabond.  All sponge no counts were correct at the end  of this case.  At the end of the case we reviewed the infection status of the case. Patient: Destiny Stewart Emergency General Surgery Service Patient Case: Urgent Infection Present At Time Of Surgery (PATOS): None  Deward Foy, MD General, Bariatric, & Minimally Invasive Surgery [facility], [place]

## 2024-09-25 NOTE — Progress Notes (Signed)
 Progress Note: General Surgery Service   Chief Complaint/Subjective: Feeling better today  Objective: Vital signs in last 24 hours: Temp:  [97.9 F (36.6 C)-99.3 F (37.4 C)] (P) 99.3 F (37.4 C) (10/31 0920) Pulse Rate:  [67-98] (P) 80 (10/31 0920) Resp:  [13-18] (P) 16 (10/31 0920) BP: (126-154)/(70-90) (P) 136/91 (10/31 0920) SpO2:  [97 %-100 %] 98 % (10/31 0929) Weight:  [79.4 kg] (P) 79.4 kg (10/31 0920) Last BM Date : 09/23/24  Intake/Output from previous day: 10/30 0701 - 10/31 0700 In: 1561.7 [P.O.:960; I.V.:601.7] Out: 0  Intake/Output this shift: No intake/output data recorded.  Constitutional: NAD; conversant; no deformities Eyes: Moist conjunctiva; no lid lag; anicteric; PERRL Neck: Trachea midline; no thyromegaly Lungs: Normal respiratory effort; no tactile fremitus CV: RRR; no palpable thrills; no pitting edema GI: Abd Soft, nontender; no palpable hepatosplenomegaly MSK: Normal range of motion of extremities; no clubbing/cyanosis Psychiatric: Appropriate affect; alert and oriented x3 Lymphatic: No palpable cervical or axillary lymphadenopathy  Lab Results: CBC  Recent Labs    09/24/24 1428 09/25/24 0511  WBC 10.3 8.6  HGB 15.1* 14.5  HCT 46.9* 44.6  PLT 356 373   BMET Recent Labs    09/24/24 0144 09/24/24 1428 09/25/24 0511  NA 136  --  139  K 3.9  --  3.8  CL 101  --  103  CO2 23  --  23  GLUCOSE 157*  --  113*  BUN 9  --  9  CREATININE 0.81 0.70 0.59  CALCIUM 10.0  --  9.0   PT/INR No results for input(s): LABPROT, INR in the last 72 hours. ABG No results for input(s): PHART, HCO3 in the last 72 hours.  Invalid input(s): PCO2, PO2  Anti-infectives: Anti-infectives (From admission, onward)    None       Medications: Scheduled Meds:  [MAR Hold] docusate sodium   100 mg Oral BID   [MAR Hold] enoxaparin (LOVENOX) injection  40 mg Subcutaneous Q24H   [MAR Hold] senna-docusate  1 tablet Oral Daily   Continuous  Infusions:  lactated ringers  100 mL/hr at 09/24/24 2359   lactated ringers  10 mL/hr at 09/25/24 0815   PRN Meds:.[MAR Hold] acetaminophen  **OR** [MAR Hold] acetaminophen , [MAR Hold] diphenhydrAMINE  **OR** [MAR Hold] diphenhydrAMINE , [MAR Hold]  HYDROmorphone  (DILAUDID ) injection, [MAR Hold] methocarbamol **OR** [MAR Hold] methocarbamol (ROBAXIN) injection, [MAR Hold] metoprolol tartrate, [MAR Hold] ondansetron  **OR** [MAR Hold] ondansetron  (ZOFRAN ) IV, [MAR Hold] oxyCODONE , [MAR Hold] simethicone   Assessment/Plan: Destiny Stewart is a 48 year old female with a right diaphragmatic hernia.  She has no history of trauma or surgery in this area.  I have no older imaging to compare this to.  She describes right shoulder and periumbilical pain.  She has had three of these episodes over the last two years.  I recommended Robotic right diaphragmatic hernia repair with mesh.  We discussed the procedure, its risks, benefits and alternatives.  After a full discussion and all questions answered, the patient granted consent to proceed.  We will proceed urgently to the OR for concern for partial colonic obstruction.   LOS: 1 day    Destiny JINNY Foy, MD  [facility] Use [email or web address] to contact on call provider  Daily Billing: [id number] - High MDM

## 2024-09-25 NOTE — Plan of Care (Signed)
  Problem: Health Behavior/Discharge Planning: Goal: Ability to manage health-related needs will improve Outcome: Progressing   Problem: Activity: Goal: Risk for activity intolerance will decrease 09/25/2024 1608 by Noella Hands, RN Outcome: Progressing 09/25/2024 1608 by Noella Hands, RN Outcome: Progressing   Problem: Nutrition: Goal: Adequate nutrition will be maintained Outcome: Progressing

## 2024-09-25 NOTE — Anesthesia Preprocedure Evaluation (Signed)
 Anesthesia Evaluation  Patient identified by MRN, date of birth, ID band Patient awake    Reviewed: Allergy & Precautions, H&P , NPO status , Patient's Chart, lab work & pertinent test results  Airway Mallampati: II  TM Distance: >3 FB Neck ROM: Full    Dental no notable dental hx.    Pulmonary neg pulmonary ROS   Pulmonary exam normal breath sounds clear to auscultation       Cardiovascular (-) hypertension(-) angina (-) Past MI negative cardio ROS Normal cardiovascular exam Rhythm:Regular Rate:Normal     Neuro/Psych neg Seizures negative neurological ROS  negative psych ROS   GI/Hepatic Neg liver ROS,,,diaphragmatic hernia   Endo/Other  negative endocrine ROS    Renal/GU negative Renal ROS  negative genitourinary   Musculoskeletal negative musculoskeletal ROS (+)    Abdominal   Peds negative pediatric ROS (+)  Hematology negative hematology ROS (+)   Anesthesia Other Findings   Reproductive/Obstetrics negative OB ROS                              Anesthesia Physical Anesthesia Plan  ASA: 2  Anesthesia Plan: General   Post-op Pain Management:    Induction: Intravenous  PONV Risk Score and Plan: 3 and Ondansetron , Dexamethasone  and Midazolam   Airway Management Planned: Oral ETT  Additional Equipment: None  Intra-op Plan:   Post-operative Plan: Extubation in OR  Informed Consent: I have reviewed the patients History and Physical, chart, labs and discussed the procedure including the risks, benefits and alternatives for the proposed anesthesia with the patient or authorized representative who has indicated his/her understanding and acceptance.     Dental advisory given  Plan Discussed with: CRNA  Anesthesia Plan Comments:         Anesthesia Quick Evaluation

## 2024-09-25 NOTE — Anesthesia Procedure Notes (Signed)
 Procedure Name: Intubation Date/Time: 09/25/2024 11:28 AM  Performed by: Kathern Rollene LABOR, CRNAPre-anesthesia Checklist: Patient identified, Emergency Drugs available, Suction available and Patient being monitored Patient Re-evaluated:Patient Re-evaluated prior to induction Oxygen Delivery Method: Circle system utilized Preoxygenation: Pre-oxygenation with 100% oxygen Induction Type: IV induction Ventilation: Mask ventilation without difficulty Laryngoscope Size: Glidescope and 3 Grade View: Grade I Tube type: Oral Tube size: 7.0 mm Number of attempts: 1 Airway Equipment and Method: Stylet Placement Confirmation: ETT inserted through vocal cords under direct vision, positive ETCO2 and breath sounds checked- equal and bilateral Secured at: 23 (ETT BY m4 J COOK) cm Tube secured with: Tape Dental Injury: Teeth and Oropharynx as per pre-operative assessment

## 2024-09-25 NOTE — Plan of Care (Signed)
   Problem: Education: Goal: Knowledge of General Education information will improve Description Including pain rating scale, medication(s)/side effects and non-pharmacologic comfort measures Outcome: Progressing   Problem: Health Behavior/Discharge Planning: Goal: Ability to manage health-related needs will improve Outcome: Progressing

## 2024-09-25 NOTE — Progress Notes (Signed)
   09/25/24 1131  TOC Brief Assessment  Insurance and Status Reviewed  Patient has primary care physician Yes  Home environment has been reviewed home with spouse  Prior level of function: independent  Prior/Current Home Services No current home services  Social Drivers of Health Review SDOH reviewed no interventions necessary  Readmission risk has been reviewed Yes  Transition of care needs no transition of care needs at this time

## 2024-09-26 NOTE — Discharge Summary (Signed)
 Physician Discharge Summary  Patient ID: Destiny Stewart MRN: [id number] DOB/AGE: 05/26/1976 48 y.o.  Admit date: 09/24/2024 Discharge date: 09/26/2024  Admission Diagnoses: Patient Active Problem List   Diagnosis Date Noted   Diaphragmatic hernia 09/24/2024   Rib pain on right side 09/29/2015   Rib cage region somatic dysfunction 09/29/2015   Abnormal uterine bleeding, postpartum 12/19/2014    Discharge Diagnoses:  Principal Problem:   Diaphragmatic hernia And same as above  Discharged Condition: stable  Hospital Course:  Pt was admitted from the ED 10/30 for abdominal pain with diaphragmatic hernia with colon in it.  She underwent robotic repair 10/31 with mesh and cecopexy by Dr. Lyndel.  She did well overnight and was able to advance to full liquids without difficulty.  She had good oral pain control and was ambulatory.  She denies n/v. She has had a small amount of flatus.    Consults: None  Significant Diagnostic Studies: labs: HCT 44.6, Cr 0.59 yesterday  Treatments: surgery: see above  Discharge Exam: Blood pressure 139/76, pulse 70, temperature 98.1 F (36.7 C), temperature source Oral, resp. rate 18, height 5' 7 (1.702 m), weight 79.4 kg, SpO2 99%, not currently breastfeeding. General appearance: alert, cooperative, and no distress Resp: breathing comfortably GI: soft, non distended, approp tender.  Dried blood on one incision.   Extremities: extremities normal, atraumatic, no cyanosis or edema  Disposition: Discharge disposition: 01-Home or Self Care       Discharge Instructions     Call MD for:  difficulty breathing, headache or visual disturbances   Complete by: As directed    Call MD for:  hives   Complete by: As directed    Call MD for:  persistant nausea and vomiting   Complete by: As directed    Call MD for:  redness, tenderness, or signs of infection (pain, swelling, redness, odor or green/yellow discharge around incision site)   Complete  by: As directed    Call MD for:  severe uncontrolled pain   Complete by: As directed    Call MD for:  temperature >100.4   Complete by: As directed    Diet full liquid   Complete by: As directed    Increase activity slowly   Complete by: As directed       Allergies as of 09/26/2024       Reactions   Penicillins Rash   As a child Has patient had a PCN reaction causing immediate rash, facial/tongue/throat swelling, SOB or lightheadedness with hypotension: Unknown Has patient had a PCN reaction causing severe rash involving mucus membranes or skin necrosis: Uknown Has patient had a PCN reaction that required hospitalization: Unknown Has patient had a PCN reaction occurring within the last 10 years: No If all of the above answers are NO, then may proceed with Cephalosporin use.        Medication List     TAKE these medications    acetaminophen  500 MG tablet Commonly known as: TYLENOL  Take 2 tablets (1,000 mg total) by mouth 4 (four) times daily.   ibuprofen  600 MG tablet Commonly known as: ADVIL  Take 1 tablet (600 mg total) by mouth 4 (four) times daily. What changed:  medication strength how much to take when to take this reasons to take this   methocarbamol 750 MG tablet Commonly known as: ROBAXIN Take 1 tablet (750 mg total) by mouth 4 (four) times daily.   multivitamin with minerals tablet Take 1 tablet by mouth daily.   norethindrone  5 MG tablet Commonly known as: AYGESTIN Take 5 mg by mouth daily.   oxyCODONE  5 MG immediate release tablet Commonly known as: Roxicodone  Take 1 tablet (5 mg total) by mouth every 4 (four) hours as needed.   polyethylene glycol 17 g packet Commonly known as: MiraLax Take 17 g by mouth daily.   prenatal multivitamin Tabs tablet Take 1 tablet by mouth daily at 12 noon.        Follow-up Information     Stechschulte, Deward PARAS, MD Follow up on 10/08/2024.   Specialty: Surgery Why: 10am, Arrive 30 minutes prior to your  appointment time, Please bring your insurance card and photo ID Contact information: [address] [place] [address] [phone]                 Signed: Jina Nephew 09/26/2024, 8:19 AM

## 2024-09-26 NOTE — Plan of Care (Signed)
   Problem: Education: Goal: Knowledge of General Education information will improve Description Including pain rating scale, medication(s)/side effects and non-pharmacologic comfort measures Outcome: Progressing   Problem: Health Behavior/Discharge Planning: Goal: Ability to manage health-related needs will improve Outcome: Progressing

## 2024-09-28 ENCOUNTER — Encounter (HOSPITAL_COMMUNITY): Payer: Self-pay | Admitting: Surgery

## 2024-09-28 NOTE — Anesthesia Postprocedure Evaluation (Signed)
 Anesthesia Post Note  Patient: AMILIAH CAMPISI  Procedure(s) Performed: REPAIR, HERNIA DIAPHRAGMATIC ROBOT-ASSISTED WITH ACECALPEXY (Abdomen)     Patient location during evaluation: PACU Anesthesia Type: General Level of consciousness: awake and alert Pain management: pain level controlled Vital Signs Assessment: post-procedure vital signs reviewed and stable Respiratory status: spontaneous breathing, nonlabored ventilation, respiratory function stable and patient connected to nasal cannula oxygen Cardiovascular status: blood pressure returned to baseline and stable Postop Assessment: no apparent nausea or vomiting Anesthetic complications: no   No notable events documented.  Last Vitals:  Vitals:   09/26/24 0201 09/26/24 0603  BP: 138/81 139/76  Pulse: 74 70  Resp: 18 18  Temp: 36.9 C 36.7 C  SpO2: 96% 99%    Last Pain:  Vitals:   09/26/24 0905  TempSrc:   PainSc: 2                  Thom JONELLE Peoples

## 2025-02-18 ENCOUNTER — Ambulatory Visit (HOSPITAL_BASED_OUTPATIENT_CLINIC_OR_DEPARTMENT_OTHER): Admitting: Cardiovascular Disease
